# Patient Record
Sex: Male | Born: 1935 | Race: Black or African American | Hispanic: No | Marital: Married | Smoking: Never smoker
Health system: Southern US, Community
[De-identification: ages and names within clinical notes are randomized; demographics above are authoritative.]

## PROBLEM LIST (undated history)

## (undated) DIAGNOSIS — G309 Alzheimer's disease, unspecified: Secondary | ICD-10-CM

## (undated) DIAGNOSIS — F0280 Dementia in other diseases classified elsewhere without behavioral disturbance: Secondary | ICD-10-CM

## (undated) DIAGNOSIS — E119 Type 2 diabetes mellitus without complications: Secondary | ICD-10-CM

## (undated) DIAGNOSIS — I10 Essential (primary) hypertension: Secondary | ICD-10-CM

---

## 2011-11-01 DIAGNOSIS — I10 Essential (primary) hypertension: Secondary | ICD-10-CM | POA: Diagnosis not present

## 2011-11-01 DIAGNOSIS — E785 Hyperlipidemia, unspecified: Secondary | ICD-10-CM | POA: Diagnosis not present

## 2011-12-06 DIAGNOSIS — E11319 Type 2 diabetes mellitus with unspecified diabetic retinopathy without macular edema: Secondary | ICD-10-CM | POA: Diagnosis not present

## 2011-12-06 DIAGNOSIS — H2510 Age-related nuclear cataract, unspecified eye: Secondary | ICD-10-CM | POA: Diagnosis not present

## 2012-06-19 DIAGNOSIS — E11319 Type 2 diabetes mellitus with unspecified diabetic retinopathy without macular edema: Secondary | ICD-10-CM | POA: Diagnosis not present

## 2012-06-19 DIAGNOSIS — H2510 Age-related nuclear cataract, unspecified eye: Secondary | ICD-10-CM | POA: Diagnosis not present

## 2012-06-25 DIAGNOSIS — E559 Vitamin D deficiency, unspecified: Secondary | ICD-10-CM | POA: Diagnosis not present

## 2012-06-25 DIAGNOSIS — N400 Enlarged prostate without lower urinary tract symptoms: Secondary | ICD-10-CM | POA: Diagnosis not present

## 2012-06-25 DIAGNOSIS — E785 Hyperlipidemia, unspecified: Secondary | ICD-10-CM | POA: Diagnosis not present

## 2012-11-13 DIAGNOSIS — E785 Hyperlipidemia, unspecified: Secondary | ICD-10-CM | POA: Diagnosis not present

## 2012-11-13 DIAGNOSIS — F0390 Unspecified dementia without behavioral disturbance: Secondary | ICD-10-CM | POA: Diagnosis not present

## 2013-04-01 DIAGNOSIS — I10 Essential (primary) hypertension: Secondary | ICD-10-CM | POA: Diagnosis not present

## 2013-04-01 DIAGNOSIS — E119 Type 2 diabetes mellitus without complications: Secondary | ICD-10-CM | POA: Diagnosis not present

## 2013-04-05 DIAGNOSIS — R0602 Shortness of breath: Secondary | ICD-10-CM | POA: Diagnosis not present

## 2013-04-05 DIAGNOSIS — E781 Pure hyperglyceridemia: Secondary | ICD-10-CM | POA: Diagnosis not present

## 2013-04-05 DIAGNOSIS — E559 Vitamin D deficiency, unspecified: Secondary | ICD-10-CM | POA: Diagnosis not present

## 2013-04-26 DIAGNOSIS — E7800 Pure hypercholesterolemia, unspecified: Secondary | ICD-10-CM | POA: Diagnosis not present

## 2013-04-26 DIAGNOSIS — E119 Type 2 diabetes mellitus without complications: Secondary | ICD-10-CM | POA: Diagnosis not present

## 2013-04-26 DIAGNOSIS — F0390 Unspecified dementia without behavioral disturbance: Secondary | ICD-10-CM | POA: Diagnosis not present

## 2013-04-26 DIAGNOSIS — I10 Essential (primary) hypertension: Secondary | ICD-10-CM | POA: Diagnosis not present

## 2013-06-28 DIAGNOSIS — I10 Essential (primary) hypertension: Secondary | ICD-10-CM | POA: Diagnosis not present

## 2013-06-28 DIAGNOSIS — G589 Mononeuropathy, unspecified: Secondary | ICD-10-CM | POA: Diagnosis not present

## 2013-07-20 DIAGNOSIS — B351 Tinea unguium: Secondary | ICD-10-CM | POA: Diagnosis not present

## 2013-07-20 DIAGNOSIS — M79609 Pain in unspecified limb: Secondary | ICD-10-CM | POA: Diagnosis not present

## 2013-07-20 DIAGNOSIS — E119 Type 2 diabetes mellitus without complications: Secondary | ICD-10-CM | POA: Diagnosis not present

## 2013-07-27 DIAGNOSIS — IMO0002 Reserved for concepts with insufficient information to code with codable children: Secondary | ICD-10-CM | POA: Diagnosis not present

## 2013-07-27 DIAGNOSIS — G5750 Tarsal tunnel syndrome, unspecified lower limb: Secondary | ICD-10-CM | POA: Diagnosis not present

## 2013-08-27 DIAGNOSIS — I10 Essential (primary) hypertension: Secondary | ICD-10-CM | POA: Diagnosis not present

## 2013-09-27 DIAGNOSIS — E1139 Type 2 diabetes mellitus with other diabetic ophthalmic complication: Secondary | ICD-10-CM | POA: Diagnosis not present

## 2013-09-27 DIAGNOSIS — E11329 Type 2 diabetes mellitus with mild nonproliferative diabetic retinopathy without macular edema: Secondary | ICD-10-CM | POA: Diagnosis not present

## 2013-09-27 DIAGNOSIS — H2510 Age-related nuclear cataract, unspecified eye: Secondary | ICD-10-CM | POA: Diagnosis not present

## 2013-09-28 DIAGNOSIS — B351 Tinea unguium: Secondary | ICD-10-CM | POA: Diagnosis not present

## 2013-09-28 DIAGNOSIS — M79609 Pain in unspecified limb: Secondary | ICD-10-CM | POA: Diagnosis not present

## 2013-10-26 DIAGNOSIS — M79609 Pain in unspecified limb: Secondary | ICD-10-CM | POA: Diagnosis not present

## 2013-10-26 DIAGNOSIS — B351 Tinea unguium: Secondary | ICD-10-CM | POA: Diagnosis not present

## 2013-11-26 DIAGNOSIS — E119 Type 2 diabetes mellitus without complications: Secondary | ICD-10-CM | POA: Diagnosis not present

## 2013-11-26 DIAGNOSIS — F0390 Unspecified dementia without behavioral disturbance: Secondary | ICD-10-CM | POA: Diagnosis not present

## 2013-11-26 DIAGNOSIS — I10 Essential (primary) hypertension: Secondary | ICD-10-CM | POA: Diagnosis not present

## 2013-11-26 DIAGNOSIS — E7800 Pure hypercholesterolemia, unspecified: Secondary | ICD-10-CM | POA: Diagnosis not present

## 2013-12-20 DIAGNOSIS — M545 Low back pain, unspecified: Secondary | ICD-10-CM | POA: Diagnosis not present

## 2013-12-20 DIAGNOSIS — M48061 Spinal stenosis, lumbar region without neurogenic claudication: Secondary | ICD-10-CM | POA: Diagnosis not present

## 2013-12-22 DIAGNOSIS — M545 Low back pain, unspecified: Secondary | ICD-10-CM | POA: Diagnosis not present

## 2013-12-22 DIAGNOSIS — M48061 Spinal stenosis, lumbar region without neurogenic claudication: Secondary | ICD-10-CM | POA: Diagnosis not present

## 2013-12-27 DIAGNOSIS — M545 Low back pain, unspecified: Secondary | ICD-10-CM | POA: Diagnosis not present

## 2013-12-27 DIAGNOSIS — M48061 Spinal stenosis, lumbar region without neurogenic claudication: Secondary | ICD-10-CM | POA: Diagnosis not present

## 2013-12-29 DIAGNOSIS — M545 Low back pain, unspecified: Secondary | ICD-10-CM | POA: Diagnosis not present

## 2013-12-29 DIAGNOSIS — M48061 Spinal stenosis, lumbar region without neurogenic claudication: Secondary | ICD-10-CM | POA: Diagnosis not present

## 2014-01-03 DIAGNOSIS — M545 Low back pain, unspecified: Secondary | ICD-10-CM | POA: Diagnosis not present

## 2014-01-03 DIAGNOSIS — M48061 Spinal stenosis, lumbar region without neurogenic claudication: Secondary | ICD-10-CM | POA: Diagnosis not present

## 2014-01-06 DIAGNOSIS — M545 Low back pain, unspecified: Secondary | ICD-10-CM | POA: Diagnosis not present

## 2014-01-06 DIAGNOSIS — M48061 Spinal stenosis, lumbar region without neurogenic claudication: Secondary | ICD-10-CM | POA: Diagnosis not present

## 2014-01-13 DIAGNOSIS — M545 Low back pain, unspecified: Secondary | ICD-10-CM | POA: Diagnosis not present

## 2014-01-13 DIAGNOSIS — M48061 Spinal stenosis, lumbar region without neurogenic claudication: Secondary | ICD-10-CM | POA: Diagnosis not present

## 2014-01-17 DIAGNOSIS — M48061 Spinal stenosis, lumbar region without neurogenic claudication: Secondary | ICD-10-CM | POA: Diagnosis not present

## 2014-01-17 DIAGNOSIS — M545 Low back pain, unspecified: Secondary | ICD-10-CM | POA: Diagnosis not present

## 2014-01-20 DIAGNOSIS — M48061 Spinal stenosis, lumbar region without neurogenic claudication: Secondary | ICD-10-CM | POA: Diagnosis not present

## 2014-01-20 DIAGNOSIS — M545 Low back pain, unspecified: Secondary | ICD-10-CM | POA: Diagnosis not present

## 2014-01-24 DIAGNOSIS — M545 Low back pain, unspecified: Secondary | ICD-10-CM | POA: Diagnosis not present

## 2014-01-24 DIAGNOSIS — M48061 Spinal stenosis, lumbar region without neurogenic claudication: Secondary | ICD-10-CM | POA: Diagnosis not present

## 2014-01-27 DIAGNOSIS — M545 Low back pain, unspecified: Secondary | ICD-10-CM | POA: Diagnosis not present

## 2014-01-27 DIAGNOSIS — M48061 Spinal stenosis, lumbar region without neurogenic claudication: Secondary | ICD-10-CM | POA: Diagnosis not present

## 2014-01-31 DIAGNOSIS — M48061 Spinal stenosis, lumbar region without neurogenic claudication: Secondary | ICD-10-CM | POA: Diagnosis not present

## 2014-01-31 DIAGNOSIS — M545 Low back pain, unspecified: Secondary | ICD-10-CM | POA: Diagnosis not present

## 2014-02-21 DIAGNOSIS — B351 Tinea unguium: Secondary | ICD-10-CM | POA: Diagnosis not present

## 2014-02-21 DIAGNOSIS — M79609 Pain in unspecified limb: Secondary | ICD-10-CM | POA: Diagnosis not present

## 2014-02-25 DIAGNOSIS — E119 Type 2 diabetes mellitus without complications: Secondary | ICD-10-CM | POA: Diagnosis not present

## 2014-02-25 DIAGNOSIS — E7800 Pure hypercholesterolemia, unspecified: Secondary | ICD-10-CM | POA: Diagnosis not present

## 2014-02-25 DIAGNOSIS — I10 Essential (primary) hypertension: Secondary | ICD-10-CM | POA: Diagnosis not present

## 2014-02-25 DIAGNOSIS — M1250 Traumatic arthropathy, unspecified site: Secondary | ICD-10-CM | POA: Diagnosis not present

## 2014-05-06 DIAGNOSIS — E1059 Type 1 diabetes mellitus with other circulatory complications: Secondary | ICD-10-CM | POA: Diagnosis not present

## 2014-05-06 DIAGNOSIS — I739 Peripheral vascular disease, unspecified: Secondary | ICD-10-CM | POA: Diagnosis not present

## 2014-05-06 DIAGNOSIS — L608 Other nail disorders: Secondary | ICD-10-CM | POA: Diagnosis not present

## 2014-06-27 DIAGNOSIS — E119 Type 2 diabetes mellitus without complications: Secondary | ICD-10-CM | POA: Diagnosis not present

## 2014-06-27 DIAGNOSIS — R413 Other amnesia: Secondary | ICD-10-CM | POA: Diagnosis not present

## 2014-06-27 DIAGNOSIS — M1990 Unspecified osteoarthritis, unspecified site: Secondary | ICD-10-CM | POA: Diagnosis not present

## 2014-08-01 DIAGNOSIS — I10 Essential (primary) hypertension: Secondary | ICD-10-CM | POA: Diagnosis not present

## 2014-08-01 DIAGNOSIS — E1100 Type 2 diabetes mellitus with hyperosmolarity without nonketotic hyperglycemic-hyperosmolar coma (NKHHC): Secondary | ICD-10-CM | POA: Diagnosis not present

## 2014-08-01 DIAGNOSIS — E119 Type 2 diabetes mellitus without complications: Secondary | ICD-10-CM | POA: Diagnosis not present

## 2014-08-01 DIAGNOSIS — E780 Pure hypercholesterolemia: Secondary | ICD-10-CM | POA: Diagnosis not present

## 2014-08-01 DIAGNOSIS — F0390 Unspecified dementia without behavioral disturbance: Secondary | ICD-10-CM | POA: Diagnosis not present

## 2014-08-18 DIAGNOSIS — I739 Peripheral vascular disease, unspecified: Secondary | ICD-10-CM | POA: Diagnosis not present

## 2014-08-18 DIAGNOSIS — L603 Nail dystrophy: Secondary | ICD-10-CM | POA: Diagnosis not present

## 2014-08-18 DIAGNOSIS — E1051 Type 1 diabetes mellitus with diabetic peripheral angiopathy without gangrene: Secondary | ICD-10-CM | POA: Diagnosis not present

## 2014-08-18 DIAGNOSIS — L84 Corns and callosities: Secondary | ICD-10-CM | POA: Diagnosis not present

## 2014-11-10 DIAGNOSIS — I739 Peripheral vascular disease, unspecified: Secondary | ICD-10-CM | POA: Diagnosis not present

## 2014-11-10 DIAGNOSIS — L603 Nail dystrophy: Secondary | ICD-10-CM | POA: Diagnosis not present

## 2014-11-10 DIAGNOSIS — E1051 Type 1 diabetes mellitus with diabetic peripheral angiopathy without gangrene: Secondary | ICD-10-CM | POA: Diagnosis not present

## 2014-11-30 DIAGNOSIS — E1140 Type 2 diabetes mellitus with diabetic neuropathy, unspecified: Secondary | ICD-10-CM | POA: Diagnosis not present

## 2014-11-30 DIAGNOSIS — E1100 Type 2 diabetes mellitus with hyperosmolarity without nonketotic hyperglycemic-hyperosmolar coma (NKHHC): Secondary | ICD-10-CM | POA: Diagnosis not present

## 2014-11-30 DIAGNOSIS — F0390 Unspecified dementia without behavioral disturbance: Secondary | ICD-10-CM | POA: Diagnosis not present

## 2014-11-30 DIAGNOSIS — E782 Mixed hyperlipidemia: Secondary | ICD-10-CM | POA: Diagnosis not present

## 2014-11-30 DIAGNOSIS — I10 Essential (primary) hypertension: Secondary | ICD-10-CM | POA: Diagnosis not present

## 2015-01-19 DIAGNOSIS — L603 Nail dystrophy: Secondary | ICD-10-CM | POA: Diagnosis not present

## 2015-01-19 DIAGNOSIS — I739 Peripheral vascular disease, unspecified: Secondary | ICD-10-CM | POA: Diagnosis not present

## 2015-01-19 DIAGNOSIS — E1051 Type 1 diabetes mellitus with diabetic peripheral angiopathy without gangrene: Secondary | ICD-10-CM | POA: Diagnosis not present

## 2015-01-30 DIAGNOSIS — F0390 Unspecified dementia without behavioral disturbance: Secondary | ICD-10-CM | POA: Diagnosis not present

## 2015-01-30 DIAGNOSIS — I10 Essential (primary) hypertension: Secondary | ICD-10-CM | POA: Diagnosis not present

## 2015-01-30 DIAGNOSIS — R609 Edema, unspecified: Secondary | ICD-10-CM | POA: Diagnosis not present

## 2015-01-30 DIAGNOSIS — E119 Type 2 diabetes mellitus without complications: Secondary | ICD-10-CM | POA: Diagnosis not present

## 2015-03-08 DIAGNOSIS — I10 Essential (primary) hypertension: Secondary | ICD-10-CM | POA: Diagnosis not present

## 2015-03-08 DIAGNOSIS — R609 Edema, unspecified: Secondary | ICD-10-CM | POA: Diagnosis not present

## 2015-03-08 DIAGNOSIS — E1100 Type 2 diabetes mellitus with hyperosmolarity without nonketotic hyperglycemic-hyperosmolar coma (NKHHC): Secondary | ICD-10-CM | POA: Diagnosis not present

## 2015-03-08 DIAGNOSIS — E1140 Type 2 diabetes mellitus with diabetic neuropathy, unspecified: Secondary | ICD-10-CM | POA: Diagnosis not present

## 2015-03-08 DIAGNOSIS — E780 Pure hypercholesterolemia: Secondary | ICD-10-CM | POA: Diagnosis not present

## 2015-03-22 DIAGNOSIS — L03116 Cellulitis of left lower limb: Secondary | ICD-10-CM | POA: Diagnosis not present

## 2015-03-22 DIAGNOSIS — I8311 Varicose veins of right lower extremity with inflammation: Secondary | ICD-10-CM | POA: Diagnosis not present

## 2015-03-22 DIAGNOSIS — I872 Venous insufficiency (chronic) (peripheral): Secondary | ICD-10-CM | POA: Diagnosis not present

## 2015-03-22 DIAGNOSIS — I8312 Varicose veins of left lower extremity with inflammation: Secondary | ICD-10-CM | POA: Diagnosis not present

## 2015-04-06 DIAGNOSIS — L603 Nail dystrophy: Secondary | ICD-10-CM | POA: Diagnosis not present

## 2015-04-06 DIAGNOSIS — I739 Peripheral vascular disease, unspecified: Secondary | ICD-10-CM | POA: Diagnosis not present

## 2015-04-06 DIAGNOSIS — E1051 Type 1 diabetes mellitus with diabetic peripheral angiopathy without gangrene: Secondary | ICD-10-CM | POA: Diagnosis not present

## 2015-04-07 DIAGNOSIS — E1140 Type 2 diabetes mellitus with diabetic neuropathy, unspecified: Secondary | ICD-10-CM | POA: Diagnosis not present

## 2015-04-07 DIAGNOSIS — F0390 Unspecified dementia without behavioral disturbance: Secondary | ICD-10-CM | POA: Diagnosis not present

## 2015-04-07 DIAGNOSIS — L89892 Pressure ulcer of other site, stage 2: Secondary | ICD-10-CM | POA: Diagnosis not present

## 2015-05-09 DIAGNOSIS — E1136 Type 2 diabetes mellitus with diabetic cataract: Secondary | ICD-10-CM | POA: Diagnosis not present

## 2015-05-09 DIAGNOSIS — E11329 Type 2 diabetes mellitus with mild nonproliferative diabetic retinopathy without macular edema: Secondary | ICD-10-CM | POA: Diagnosis not present

## 2015-06-07 DIAGNOSIS — E1140 Type 2 diabetes mellitus with diabetic neuropathy, unspecified: Secondary | ICD-10-CM | POA: Diagnosis not present

## 2015-06-07 DIAGNOSIS — I10 Essential (primary) hypertension: Secondary | ICD-10-CM | POA: Diagnosis not present

## 2015-06-07 DIAGNOSIS — F0390 Unspecified dementia without behavioral disturbance: Secondary | ICD-10-CM | POA: Diagnosis not present

## 2015-06-07 DIAGNOSIS — L89892 Pressure ulcer of other site, stage 2: Secondary | ICD-10-CM | POA: Diagnosis not present

## 2015-06-22 DIAGNOSIS — I739 Peripheral vascular disease, unspecified: Secondary | ICD-10-CM | POA: Diagnosis not present

## 2015-06-22 DIAGNOSIS — L603 Nail dystrophy: Secondary | ICD-10-CM | POA: Diagnosis not present

## 2015-06-22 DIAGNOSIS — E1051 Type 1 diabetes mellitus with diabetic peripheral angiopathy without gangrene: Secondary | ICD-10-CM | POA: Diagnosis not present

## 2015-07-17 DIAGNOSIS — M792 Neuralgia and neuritis, unspecified: Secondary | ICD-10-CM | POA: Diagnosis not present

## 2015-10-20 DIAGNOSIS — E089 Diabetes mellitus due to underlying condition without complications: Secondary | ICD-10-CM | POA: Diagnosis not present

## 2015-10-20 DIAGNOSIS — R609 Edema, unspecified: Secondary | ICD-10-CM | POA: Diagnosis not present

## 2015-10-20 DIAGNOSIS — E782 Mixed hyperlipidemia: Secondary | ICD-10-CM | POA: Diagnosis not present

## 2015-10-20 DIAGNOSIS — Z6833 Body mass index (BMI) 33.0-33.9, adult: Secondary | ICD-10-CM | POA: Diagnosis not present

## 2015-10-20 DIAGNOSIS — E1140 Type 2 diabetes mellitus with diabetic neuropathy, unspecified: Secondary | ICD-10-CM | POA: Diagnosis not present

## 2015-11-10 DIAGNOSIS — E119 Type 2 diabetes mellitus without complications: Secondary | ICD-10-CM | POA: Diagnosis not present

## 2015-11-10 DIAGNOSIS — L249 Irritant contact dermatitis, unspecified cause: Secondary | ICD-10-CM | POA: Diagnosis not present

## 2015-11-10 DIAGNOSIS — F0390 Unspecified dementia without behavioral disturbance: Secondary | ICD-10-CM | POA: Diagnosis not present

## 2015-11-16 DIAGNOSIS — I739 Peripheral vascular disease, unspecified: Secondary | ICD-10-CM | POA: Diagnosis not present

## 2015-11-16 DIAGNOSIS — L603 Nail dystrophy: Secondary | ICD-10-CM | POA: Diagnosis not present

## 2015-11-16 DIAGNOSIS — E1051 Type 1 diabetes mellitus with diabetic peripheral angiopathy without gangrene: Secondary | ICD-10-CM | POA: Diagnosis not present

## 2016-03-01 DIAGNOSIS — E1151 Type 2 diabetes mellitus with diabetic peripheral angiopathy without gangrene: Secondary | ICD-10-CM | POA: Diagnosis not present

## 2016-03-01 DIAGNOSIS — L603 Nail dystrophy: Secondary | ICD-10-CM | POA: Diagnosis not present

## 2016-03-01 DIAGNOSIS — L97511 Non-pressure chronic ulcer of other part of right foot limited to breakdown of skin: Secondary | ICD-10-CM | POA: Diagnosis not present

## 2016-03-01 DIAGNOSIS — I739 Peripheral vascular disease, unspecified: Secondary | ICD-10-CM | POA: Diagnosis not present

## 2016-03-01 DIAGNOSIS — L84 Corns and callosities: Secondary | ICD-10-CM | POA: Diagnosis not present

## 2016-03-21 DIAGNOSIS — E1140 Type 2 diabetes mellitus with diabetic neuropathy, unspecified: Secondary | ICD-10-CM | POA: Diagnosis not present

## 2016-03-21 DIAGNOSIS — Z6832 Body mass index (BMI) 32.0-32.9, adult: Secondary | ICD-10-CM | POA: Diagnosis not present

## 2016-03-21 DIAGNOSIS — G308 Other Alzheimer's disease: Secondary | ICD-10-CM | POA: Diagnosis not present

## 2016-03-21 DIAGNOSIS — L249 Irritant contact dermatitis, unspecified cause: Secondary | ICD-10-CM | POA: Diagnosis not present

## 2016-04-10 DIAGNOSIS — L859 Epidermal thickening, unspecified: Secondary | ICD-10-CM | POA: Diagnosis not present

## 2016-04-10 DIAGNOSIS — D485 Neoplasm of uncertain behavior of skin: Secondary | ICD-10-CM | POA: Diagnosis not present

## 2016-04-10 DIAGNOSIS — L819 Disorder of pigmentation, unspecified: Secondary | ICD-10-CM | POA: Diagnosis not present

## 2016-04-26 DIAGNOSIS — M79671 Pain in right foot: Secondary | ICD-10-CM | POA: Diagnosis not present

## 2016-04-26 DIAGNOSIS — B079 Viral wart, unspecified: Secondary | ICD-10-CM | POA: Diagnosis not present

## 2016-05-20 DIAGNOSIS — M79672 Pain in left foot: Secondary | ICD-10-CM | POA: Diagnosis not present

## 2016-05-20 DIAGNOSIS — B079 Viral wart, unspecified: Secondary | ICD-10-CM | POA: Diagnosis not present

## 2016-05-20 DIAGNOSIS — M79671 Pain in right foot: Secondary | ICD-10-CM | POA: Diagnosis not present

## 2016-05-24 DIAGNOSIS — R069 Unspecified abnormalities of breathing: Secondary | ICD-10-CM | POA: Diagnosis not present

## 2016-05-24 DIAGNOSIS — E782 Mixed hyperlipidemia: Secondary | ICD-10-CM | POA: Diagnosis not present

## 2016-05-24 DIAGNOSIS — E1140 Type 2 diabetes mellitus with diabetic neuropathy, unspecified: Secondary | ICD-10-CM | POA: Diagnosis not present

## 2016-06-12 DIAGNOSIS — B079 Viral wart, unspecified: Secondary | ICD-10-CM | POA: Diagnosis not present

## 2016-06-12 DIAGNOSIS — M79672 Pain in left foot: Secondary | ICD-10-CM | POA: Diagnosis not present

## 2016-06-12 DIAGNOSIS — M79671 Pain in right foot: Secondary | ICD-10-CM | POA: Diagnosis not present

## 2016-07-08 DIAGNOSIS — L97422 Non-pressure chronic ulcer of left heel and midfoot with fat layer exposed: Secondary | ICD-10-CM | POA: Diagnosis not present

## 2016-07-26 DIAGNOSIS — L97421 Non-pressure chronic ulcer of left heel and midfoot limited to breakdown of skin: Secondary | ICD-10-CM | POA: Diagnosis not present

## 2016-07-26 DIAGNOSIS — L97411 Non-pressure chronic ulcer of right heel and midfoot limited to breakdown of skin: Secondary | ICD-10-CM | POA: Diagnosis not present

## 2016-08-23 DIAGNOSIS — G308 Other Alzheimer's disease: Secondary | ICD-10-CM | POA: Diagnosis not present

## 2016-08-23 DIAGNOSIS — E785 Hyperlipidemia, unspecified: Secondary | ICD-10-CM | POA: Diagnosis not present

## 2016-08-23 DIAGNOSIS — I10 Essential (primary) hypertension: Secondary | ICD-10-CM | POA: Diagnosis not present

## 2016-08-23 DIAGNOSIS — E1140 Type 2 diabetes mellitus with diabetic neuropathy, unspecified: Secondary | ICD-10-CM | POA: Diagnosis not present

## 2016-08-23 DIAGNOSIS — E1340 Other specified diabetes mellitus with diabetic neuropathy, unspecified: Secondary | ICD-10-CM | POA: Diagnosis not present

## 2016-08-23 DIAGNOSIS — E7800 Pure hypercholesterolemia, unspecified: Secondary | ICD-10-CM | POA: Diagnosis not present

## 2016-08-31 DIAGNOSIS — R413 Other amnesia: Secondary | ICD-10-CM | POA: Diagnosis not present

## 2016-08-31 DIAGNOSIS — R260 Ataxic gait: Secondary | ICD-10-CM | POA: Diagnosis not present

## 2016-08-31 DIAGNOSIS — I10 Essential (primary) hypertension: Secondary | ICD-10-CM | POA: Diagnosis not present

## 2016-08-31 DIAGNOSIS — Z794 Long term (current) use of insulin: Secondary | ICD-10-CM | POA: Diagnosis not present

## 2016-08-31 DIAGNOSIS — E1140 Type 2 diabetes mellitus with diabetic neuropathy, unspecified: Secondary | ICD-10-CM | POA: Diagnosis not present

## 2016-08-31 DIAGNOSIS — G309 Alzheimer's disease, unspecified: Secondary | ICD-10-CM | POA: Diagnosis not present

## 2016-09-02 DIAGNOSIS — E1140 Type 2 diabetes mellitus with diabetic neuropathy, unspecified: Secondary | ICD-10-CM | POA: Diagnosis not present

## 2016-09-02 DIAGNOSIS — R413 Other amnesia: Secondary | ICD-10-CM | POA: Diagnosis not present

## 2016-09-02 DIAGNOSIS — I10 Essential (primary) hypertension: Secondary | ICD-10-CM | POA: Diagnosis not present

## 2016-09-02 DIAGNOSIS — R260 Ataxic gait: Secondary | ICD-10-CM | POA: Diagnosis not present

## 2016-09-02 DIAGNOSIS — Z794 Long term (current) use of insulin: Secondary | ICD-10-CM | POA: Diagnosis not present

## 2016-09-02 DIAGNOSIS — G309 Alzheimer's disease, unspecified: Secondary | ICD-10-CM | POA: Diagnosis not present

## 2016-09-03 DIAGNOSIS — G309 Alzheimer's disease, unspecified: Secondary | ICD-10-CM | POA: Diagnosis not present

## 2016-09-03 DIAGNOSIS — E1140 Type 2 diabetes mellitus with diabetic neuropathy, unspecified: Secondary | ICD-10-CM | POA: Diagnosis not present

## 2016-09-03 DIAGNOSIS — Z794 Long term (current) use of insulin: Secondary | ICD-10-CM | POA: Diagnosis not present

## 2016-09-03 DIAGNOSIS — R260 Ataxic gait: Secondary | ICD-10-CM | POA: Diagnosis not present

## 2016-09-03 DIAGNOSIS — I10 Essential (primary) hypertension: Secondary | ICD-10-CM | POA: Diagnosis not present

## 2016-09-03 DIAGNOSIS — R413 Other amnesia: Secondary | ICD-10-CM | POA: Diagnosis not present

## 2016-09-09 DIAGNOSIS — I739 Peripheral vascular disease, unspecified: Secondary | ICD-10-CM | POA: Diagnosis not present

## 2016-09-09 DIAGNOSIS — L84 Corns and callosities: Secondary | ICD-10-CM | POA: Diagnosis not present

## 2016-09-09 DIAGNOSIS — E1151 Type 2 diabetes mellitus with diabetic peripheral angiopathy without gangrene: Secondary | ICD-10-CM | POA: Diagnosis not present

## 2016-09-09 DIAGNOSIS — L603 Nail dystrophy: Secondary | ICD-10-CM | POA: Diagnosis not present

## 2016-09-10 DIAGNOSIS — Z794 Long term (current) use of insulin: Secondary | ICD-10-CM | POA: Diagnosis not present

## 2016-09-10 DIAGNOSIS — E1140 Type 2 diabetes mellitus with diabetic neuropathy, unspecified: Secondary | ICD-10-CM | POA: Diagnosis not present

## 2016-09-10 DIAGNOSIS — I10 Essential (primary) hypertension: Secondary | ICD-10-CM | POA: Diagnosis not present

## 2016-09-10 DIAGNOSIS — R260 Ataxic gait: Secondary | ICD-10-CM | POA: Diagnosis not present

## 2016-09-10 DIAGNOSIS — G309 Alzheimer's disease, unspecified: Secondary | ICD-10-CM | POA: Diagnosis not present

## 2016-09-10 DIAGNOSIS — R413 Other amnesia: Secondary | ICD-10-CM | POA: Diagnosis not present

## 2016-09-11 DIAGNOSIS — R413 Other amnesia: Secondary | ICD-10-CM | POA: Diagnosis not present

## 2016-09-11 DIAGNOSIS — G309 Alzheimer's disease, unspecified: Secondary | ICD-10-CM | POA: Diagnosis not present

## 2016-09-11 DIAGNOSIS — I10 Essential (primary) hypertension: Secondary | ICD-10-CM | POA: Diagnosis not present

## 2016-09-11 DIAGNOSIS — Z794 Long term (current) use of insulin: Secondary | ICD-10-CM | POA: Diagnosis not present

## 2016-09-11 DIAGNOSIS — R260 Ataxic gait: Secondary | ICD-10-CM | POA: Diagnosis not present

## 2016-09-11 DIAGNOSIS — E1140 Type 2 diabetes mellitus with diabetic neuropathy, unspecified: Secondary | ICD-10-CM | POA: Diagnosis not present

## 2016-09-13 DIAGNOSIS — E1140 Type 2 diabetes mellitus with diabetic neuropathy, unspecified: Secondary | ICD-10-CM | POA: Diagnosis not present

## 2016-09-13 DIAGNOSIS — R260 Ataxic gait: Secondary | ICD-10-CM | POA: Diagnosis not present

## 2016-09-13 DIAGNOSIS — Z794 Long term (current) use of insulin: Secondary | ICD-10-CM | POA: Diagnosis not present

## 2016-09-13 DIAGNOSIS — G309 Alzheimer's disease, unspecified: Secondary | ICD-10-CM | POA: Diagnosis not present

## 2016-09-13 DIAGNOSIS — I10 Essential (primary) hypertension: Secondary | ICD-10-CM | POA: Diagnosis not present

## 2016-09-13 DIAGNOSIS — R413 Other amnesia: Secondary | ICD-10-CM | POA: Diagnosis not present

## 2016-09-16 DIAGNOSIS — R413 Other amnesia: Secondary | ICD-10-CM | POA: Diagnosis not present

## 2016-09-16 DIAGNOSIS — E1140 Type 2 diabetes mellitus with diabetic neuropathy, unspecified: Secondary | ICD-10-CM | POA: Diagnosis not present

## 2016-09-16 DIAGNOSIS — Z794 Long term (current) use of insulin: Secondary | ICD-10-CM | POA: Diagnosis not present

## 2016-09-16 DIAGNOSIS — R260 Ataxic gait: Secondary | ICD-10-CM | POA: Diagnosis not present

## 2016-09-16 DIAGNOSIS — G309 Alzheimer's disease, unspecified: Secondary | ICD-10-CM | POA: Diagnosis not present

## 2016-09-16 DIAGNOSIS — I10 Essential (primary) hypertension: Secondary | ICD-10-CM | POA: Diagnosis not present

## 2016-09-18 DIAGNOSIS — G309 Alzheimer's disease, unspecified: Secondary | ICD-10-CM | POA: Diagnosis not present

## 2016-09-18 DIAGNOSIS — Z794 Long term (current) use of insulin: Secondary | ICD-10-CM | POA: Diagnosis not present

## 2016-09-18 DIAGNOSIS — I10 Essential (primary) hypertension: Secondary | ICD-10-CM | POA: Diagnosis not present

## 2016-09-18 DIAGNOSIS — R260 Ataxic gait: Secondary | ICD-10-CM | POA: Diagnosis not present

## 2016-09-18 DIAGNOSIS — E1140 Type 2 diabetes mellitus with diabetic neuropathy, unspecified: Secondary | ICD-10-CM | POA: Diagnosis not present

## 2016-09-18 DIAGNOSIS — R413 Other amnesia: Secondary | ICD-10-CM | POA: Diagnosis not present

## 2016-09-20 DIAGNOSIS — E1140 Type 2 diabetes mellitus with diabetic neuropathy, unspecified: Secondary | ICD-10-CM | POA: Diagnosis not present

## 2016-09-20 DIAGNOSIS — G309 Alzheimer's disease, unspecified: Secondary | ICD-10-CM | POA: Diagnosis not present

## 2016-09-20 DIAGNOSIS — R260 Ataxic gait: Secondary | ICD-10-CM | POA: Diagnosis not present

## 2016-09-20 DIAGNOSIS — I10 Essential (primary) hypertension: Secondary | ICD-10-CM | POA: Diagnosis not present

## 2016-09-20 DIAGNOSIS — Z794 Long term (current) use of insulin: Secondary | ICD-10-CM | POA: Diagnosis not present

## 2016-09-20 DIAGNOSIS — R413 Other amnesia: Secondary | ICD-10-CM | POA: Diagnosis not present

## 2016-09-24 DIAGNOSIS — I10 Essential (primary) hypertension: Secondary | ICD-10-CM | POA: Diagnosis not present

## 2016-09-24 DIAGNOSIS — G309 Alzheimer's disease, unspecified: Secondary | ICD-10-CM | POA: Diagnosis not present

## 2016-09-24 DIAGNOSIS — E1140 Type 2 diabetes mellitus with diabetic neuropathy, unspecified: Secondary | ICD-10-CM | POA: Diagnosis not present

## 2016-09-24 DIAGNOSIS — R413 Other amnesia: Secondary | ICD-10-CM | POA: Diagnosis not present

## 2016-09-24 DIAGNOSIS — R260 Ataxic gait: Secondary | ICD-10-CM | POA: Diagnosis not present

## 2016-09-24 DIAGNOSIS — Z794 Long term (current) use of insulin: Secondary | ICD-10-CM | POA: Diagnosis not present

## 2016-09-25 DIAGNOSIS — Z794 Long term (current) use of insulin: Secondary | ICD-10-CM | POA: Diagnosis not present

## 2016-09-25 DIAGNOSIS — G309 Alzheimer's disease, unspecified: Secondary | ICD-10-CM | POA: Diagnosis not present

## 2016-09-25 DIAGNOSIS — E1140 Type 2 diabetes mellitus with diabetic neuropathy, unspecified: Secondary | ICD-10-CM | POA: Diagnosis not present

## 2016-09-25 DIAGNOSIS — R260 Ataxic gait: Secondary | ICD-10-CM | POA: Diagnosis not present

## 2016-09-25 DIAGNOSIS — R413 Other amnesia: Secondary | ICD-10-CM | POA: Diagnosis not present

## 2016-09-25 DIAGNOSIS — I10 Essential (primary) hypertension: Secondary | ICD-10-CM | POA: Diagnosis not present

## 2016-12-06 DIAGNOSIS — E1140 Type 2 diabetes mellitus with diabetic neuropathy, unspecified: Secondary | ICD-10-CM | POA: Diagnosis not present

## 2016-12-06 DIAGNOSIS — E7800 Pure hypercholesterolemia, unspecified: Secondary | ICD-10-CM | POA: Diagnosis not present

## 2016-12-06 DIAGNOSIS — G308 Other Alzheimer's disease: Secondary | ICD-10-CM | POA: Diagnosis not present

## 2016-12-06 DIAGNOSIS — E785 Hyperlipidemia, unspecified: Secondary | ICD-10-CM | POA: Diagnosis not present

## 2016-12-06 DIAGNOSIS — I10 Essential (primary) hypertension: Secondary | ICD-10-CM | POA: Diagnosis not present

## 2017-02-20 DIAGNOSIS — M79674 Pain in right toe(s): Secondary | ICD-10-CM | POA: Diagnosis not present

## 2017-02-20 DIAGNOSIS — M79675 Pain in left toe(s): Secondary | ICD-10-CM | POA: Diagnosis not present

## 2017-04-08 DIAGNOSIS — I10 Essential (primary) hypertension: Secondary | ICD-10-CM | POA: Diagnosis not present

## 2017-04-08 DIAGNOSIS — E7800 Pure hypercholesterolemia, unspecified: Secondary | ICD-10-CM | POA: Diagnosis not present

## 2017-04-08 DIAGNOSIS — E1140 Type 2 diabetes mellitus with diabetic neuropathy, unspecified: Secondary | ICD-10-CM | POA: Diagnosis not present

## 2017-04-08 DIAGNOSIS — G308 Other Alzheimer's disease: Secondary | ICD-10-CM | POA: Diagnosis not present

## 2017-04-08 DIAGNOSIS — E785 Hyperlipidemia, unspecified: Secondary | ICD-10-CM | POA: Diagnosis not present

## 2017-04-18 DIAGNOSIS — R32 Unspecified urinary incontinence: Secondary | ICD-10-CM | POA: Diagnosis not present

## 2017-04-18 DIAGNOSIS — Z87891 Personal history of nicotine dependence: Secondary | ICD-10-CM | POA: Diagnosis not present

## 2017-04-18 DIAGNOSIS — E119 Type 2 diabetes mellitus without complications: Secondary | ICD-10-CM | POA: Diagnosis not present

## 2017-04-18 DIAGNOSIS — G8929 Other chronic pain: Secondary | ICD-10-CM | POA: Diagnosis not present

## 2017-04-18 DIAGNOSIS — Z7982 Long term (current) use of aspirin: Secondary | ICD-10-CM | POA: Diagnosis not present

## 2017-04-18 DIAGNOSIS — L89892 Pressure ulcer of other site, stage 2: Secondary | ICD-10-CM | POA: Diagnosis not present

## 2017-04-18 DIAGNOSIS — Z7952 Long term (current) use of systemic steroids: Secondary | ICD-10-CM | POA: Diagnosis not present

## 2017-04-18 DIAGNOSIS — M549 Dorsalgia, unspecified: Secondary | ICD-10-CM | POA: Diagnosis not present

## 2017-04-18 DIAGNOSIS — Z9181 History of falling: Secondary | ICD-10-CM | POA: Diagnosis not present

## 2017-04-18 DIAGNOSIS — Z794 Long term (current) use of insulin: Secondary | ICD-10-CM | POA: Diagnosis not present

## 2017-04-18 DIAGNOSIS — M790 Rheumatism, unspecified: Secondary | ICD-10-CM | POA: Diagnosis not present

## 2017-04-18 DIAGNOSIS — I10 Essential (primary) hypertension: Secondary | ICD-10-CM | POA: Diagnosis not present

## 2017-04-18 DIAGNOSIS — F0390 Unspecified dementia without behavioral disturbance: Secondary | ICD-10-CM | POA: Diagnosis not present

## 2017-04-21 DIAGNOSIS — M549 Dorsalgia, unspecified: Secondary | ICD-10-CM | POA: Diagnosis not present

## 2017-04-21 DIAGNOSIS — F0390 Unspecified dementia without behavioral disturbance: Secondary | ICD-10-CM | POA: Diagnosis not present

## 2017-04-21 DIAGNOSIS — E119 Type 2 diabetes mellitus without complications: Secondary | ICD-10-CM | POA: Diagnosis not present

## 2017-04-21 DIAGNOSIS — G8929 Other chronic pain: Secondary | ICD-10-CM | POA: Diagnosis not present

## 2017-04-21 DIAGNOSIS — I10 Essential (primary) hypertension: Secondary | ICD-10-CM | POA: Diagnosis not present

## 2017-04-21 DIAGNOSIS — L89892 Pressure ulcer of other site, stage 2: Secondary | ICD-10-CM | POA: Diagnosis not present

## 2017-04-22 DIAGNOSIS — F0390 Unspecified dementia without behavioral disturbance: Secondary | ICD-10-CM | POA: Diagnosis not present

## 2017-04-22 DIAGNOSIS — G8929 Other chronic pain: Secondary | ICD-10-CM | POA: Diagnosis not present

## 2017-04-22 DIAGNOSIS — L89892 Pressure ulcer of other site, stage 2: Secondary | ICD-10-CM | POA: Diagnosis not present

## 2017-04-22 DIAGNOSIS — I10 Essential (primary) hypertension: Secondary | ICD-10-CM | POA: Diagnosis not present

## 2017-04-22 DIAGNOSIS — M549 Dorsalgia, unspecified: Secondary | ICD-10-CM | POA: Diagnosis not present

## 2017-04-22 DIAGNOSIS — E119 Type 2 diabetes mellitus without complications: Secondary | ICD-10-CM | POA: Diagnosis not present

## 2017-04-25 DIAGNOSIS — F0390 Unspecified dementia without behavioral disturbance: Secondary | ICD-10-CM | POA: Diagnosis not present

## 2017-04-25 DIAGNOSIS — G8929 Other chronic pain: Secondary | ICD-10-CM | POA: Diagnosis not present

## 2017-04-25 DIAGNOSIS — E119 Type 2 diabetes mellitus without complications: Secondary | ICD-10-CM | POA: Diagnosis not present

## 2017-04-25 DIAGNOSIS — M549 Dorsalgia, unspecified: Secondary | ICD-10-CM | POA: Diagnosis not present

## 2017-04-25 DIAGNOSIS — I10 Essential (primary) hypertension: Secondary | ICD-10-CM | POA: Diagnosis not present

## 2017-04-25 DIAGNOSIS — L89892 Pressure ulcer of other site, stage 2: Secondary | ICD-10-CM | POA: Diagnosis not present

## 2017-04-29 DIAGNOSIS — G8929 Other chronic pain: Secondary | ICD-10-CM | POA: Diagnosis not present

## 2017-04-29 DIAGNOSIS — L89892 Pressure ulcer of other site, stage 2: Secondary | ICD-10-CM | POA: Diagnosis not present

## 2017-04-29 DIAGNOSIS — E119 Type 2 diabetes mellitus without complications: Secondary | ICD-10-CM | POA: Diagnosis not present

## 2017-04-29 DIAGNOSIS — I10 Essential (primary) hypertension: Secondary | ICD-10-CM | POA: Diagnosis not present

## 2017-04-29 DIAGNOSIS — M549 Dorsalgia, unspecified: Secondary | ICD-10-CM | POA: Diagnosis not present

## 2017-04-29 DIAGNOSIS — F0390 Unspecified dementia without behavioral disturbance: Secondary | ICD-10-CM | POA: Diagnosis not present

## 2017-05-01 DIAGNOSIS — M549 Dorsalgia, unspecified: Secondary | ICD-10-CM | POA: Diagnosis not present

## 2017-05-01 DIAGNOSIS — I10 Essential (primary) hypertension: Secondary | ICD-10-CM | POA: Diagnosis not present

## 2017-05-01 DIAGNOSIS — E119 Type 2 diabetes mellitus without complications: Secondary | ICD-10-CM | POA: Diagnosis not present

## 2017-05-01 DIAGNOSIS — F0390 Unspecified dementia without behavioral disturbance: Secondary | ICD-10-CM | POA: Diagnosis not present

## 2017-05-01 DIAGNOSIS — L89892 Pressure ulcer of other site, stage 2: Secondary | ICD-10-CM | POA: Diagnosis not present

## 2017-05-01 DIAGNOSIS — G8929 Other chronic pain: Secondary | ICD-10-CM | POA: Diagnosis not present

## 2017-05-02 DIAGNOSIS — E119 Type 2 diabetes mellitus without complications: Secondary | ICD-10-CM | POA: Diagnosis not present

## 2017-05-02 DIAGNOSIS — M549 Dorsalgia, unspecified: Secondary | ICD-10-CM | POA: Diagnosis not present

## 2017-05-02 DIAGNOSIS — F0390 Unspecified dementia without behavioral disturbance: Secondary | ICD-10-CM | POA: Diagnosis not present

## 2017-05-02 DIAGNOSIS — L89892 Pressure ulcer of other site, stage 2: Secondary | ICD-10-CM | POA: Diagnosis not present

## 2017-05-02 DIAGNOSIS — G8929 Other chronic pain: Secondary | ICD-10-CM | POA: Diagnosis not present

## 2017-05-02 DIAGNOSIS — I10 Essential (primary) hypertension: Secondary | ICD-10-CM | POA: Diagnosis not present

## 2017-05-06 DIAGNOSIS — F0390 Unspecified dementia without behavioral disturbance: Secondary | ICD-10-CM | POA: Diagnosis not present

## 2017-05-06 DIAGNOSIS — I10 Essential (primary) hypertension: Secondary | ICD-10-CM | POA: Diagnosis not present

## 2017-05-06 DIAGNOSIS — G8929 Other chronic pain: Secondary | ICD-10-CM | POA: Diagnosis not present

## 2017-05-06 DIAGNOSIS — E119 Type 2 diabetes mellitus without complications: Secondary | ICD-10-CM | POA: Diagnosis not present

## 2017-05-06 DIAGNOSIS — L89892 Pressure ulcer of other site, stage 2: Secondary | ICD-10-CM | POA: Diagnosis not present

## 2017-05-06 DIAGNOSIS — M549 Dorsalgia, unspecified: Secondary | ICD-10-CM | POA: Diagnosis not present

## 2017-05-07 DIAGNOSIS — I10 Essential (primary) hypertension: Secondary | ICD-10-CM | POA: Diagnosis not present

## 2017-05-07 DIAGNOSIS — L89892 Pressure ulcer of other site, stage 2: Secondary | ICD-10-CM | POA: Diagnosis not present

## 2017-05-07 DIAGNOSIS — F0390 Unspecified dementia without behavioral disturbance: Secondary | ICD-10-CM | POA: Diagnosis not present

## 2017-05-07 DIAGNOSIS — M549 Dorsalgia, unspecified: Secondary | ICD-10-CM | POA: Diagnosis not present

## 2017-05-07 DIAGNOSIS — G8929 Other chronic pain: Secondary | ICD-10-CM | POA: Diagnosis not present

## 2017-05-07 DIAGNOSIS — E119 Type 2 diabetes mellitus without complications: Secondary | ICD-10-CM | POA: Diagnosis not present

## 2017-05-08 DIAGNOSIS — F0390 Unspecified dementia without behavioral disturbance: Secondary | ICD-10-CM | POA: Diagnosis not present

## 2017-05-08 DIAGNOSIS — L89892 Pressure ulcer of other site, stage 2: Secondary | ICD-10-CM | POA: Diagnosis not present

## 2017-05-08 DIAGNOSIS — M549 Dorsalgia, unspecified: Secondary | ICD-10-CM | POA: Diagnosis not present

## 2017-05-08 DIAGNOSIS — E119 Type 2 diabetes mellitus without complications: Secondary | ICD-10-CM | POA: Diagnosis not present

## 2017-05-08 DIAGNOSIS — I10 Essential (primary) hypertension: Secondary | ICD-10-CM | POA: Diagnosis not present

## 2017-05-08 DIAGNOSIS — G8929 Other chronic pain: Secondary | ICD-10-CM | POA: Diagnosis not present

## 2017-05-09 DIAGNOSIS — G8929 Other chronic pain: Secondary | ICD-10-CM | POA: Diagnosis not present

## 2017-05-09 DIAGNOSIS — I10 Essential (primary) hypertension: Secondary | ICD-10-CM | POA: Diagnosis not present

## 2017-05-09 DIAGNOSIS — M549 Dorsalgia, unspecified: Secondary | ICD-10-CM | POA: Diagnosis not present

## 2017-05-09 DIAGNOSIS — F0390 Unspecified dementia without behavioral disturbance: Secondary | ICD-10-CM | POA: Diagnosis not present

## 2017-05-09 DIAGNOSIS — E119 Type 2 diabetes mellitus without complications: Secondary | ICD-10-CM | POA: Diagnosis not present

## 2017-05-09 DIAGNOSIS — L89892 Pressure ulcer of other site, stage 2: Secondary | ICD-10-CM | POA: Diagnosis not present

## 2017-05-13 DIAGNOSIS — M549 Dorsalgia, unspecified: Secondary | ICD-10-CM | POA: Diagnosis not present

## 2017-05-13 DIAGNOSIS — G8929 Other chronic pain: Secondary | ICD-10-CM | POA: Diagnosis not present

## 2017-05-13 DIAGNOSIS — F0390 Unspecified dementia without behavioral disturbance: Secondary | ICD-10-CM | POA: Diagnosis not present

## 2017-05-13 DIAGNOSIS — L89892 Pressure ulcer of other site, stage 2: Secondary | ICD-10-CM | POA: Diagnosis not present

## 2017-05-13 DIAGNOSIS — I10 Essential (primary) hypertension: Secondary | ICD-10-CM | POA: Diagnosis not present

## 2017-05-13 DIAGNOSIS — E119 Type 2 diabetes mellitus without complications: Secondary | ICD-10-CM | POA: Diagnosis not present

## 2017-05-14 DIAGNOSIS — F0390 Unspecified dementia without behavioral disturbance: Secondary | ICD-10-CM | POA: Diagnosis not present

## 2017-05-14 DIAGNOSIS — E119 Type 2 diabetes mellitus without complications: Secondary | ICD-10-CM | POA: Diagnosis not present

## 2017-05-14 DIAGNOSIS — I10 Essential (primary) hypertension: Secondary | ICD-10-CM | POA: Diagnosis not present

## 2017-05-14 DIAGNOSIS — G8929 Other chronic pain: Secondary | ICD-10-CM | POA: Diagnosis not present

## 2017-05-14 DIAGNOSIS — M549 Dorsalgia, unspecified: Secondary | ICD-10-CM | POA: Diagnosis not present

## 2017-05-14 DIAGNOSIS — L89892 Pressure ulcer of other site, stage 2: Secondary | ICD-10-CM | POA: Diagnosis not present

## 2017-05-16 DIAGNOSIS — E119 Type 2 diabetes mellitus without complications: Secondary | ICD-10-CM | POA: Diagnosis not present

## 2017-05-16 DIAGNOSIS — M549 Dorsalgia, unspecified: Secondary | ICD-10-CM | POA: Diagnosis not present

## 2017-05-16 DIAGNOSIS — F0390 Unspecified dementia without behavioral disturbance: Secondary | ICD-10-CM | POA: Diagnosis not present

## 2017-05-16 DIAGNOSIS — G8929 Other chronic pain: Secondary | ICD-10-CM | POA: Diagnosis not present

## 2017-05-16 DIAGNOSIS — L89892 Pressure ulcer of other site, stage 2: Secondary | ICD-10-CM | POA: Diagnosis not present

## 2017-05-16 DIAGNOSIS — I10 Essential (primary) hypertension: Secondary | ICD-10-CM | POA: Diagnosis not present

## 2017-05-19 DIAGNOSIS — L89892 Pressure ulcer of other site, stage 2: Secondary | ICD-10-CM | POA: Diagnosis not present

## 2017-05-19 DIAGNOSIS — I10 Essential (primary) hypertension: Secondary | ICD-10-CM | POA: Diagnosis not present

## 2017-05-19 DIAGNOSIS — G8929 Other chronic pain: Secondary | ICD-10-CM | POA: Diagnosis not present

## 2017-05-19 DIAGNOSIS — M549 Dorsalgia, unspecified: Secondary | ICD-10-CM | POA: Diagnosis not present

## 2017-05-19 DIAGNOSIS — E119 Type 2 diabetes mellitus without complications: Secondary | ICD-10-CM | POA: Diagnosis not present

## 2017-05-19 DIAGNOSIS — F0390 Unspecified dementia without behavioral disturbance: Secondary | ICD-10-CM | POA: Diagnosis not present

## 2017-05-20 DIAGNOSIS — E119 Type 2 diabetes mellitus without complications: Secondary | ICD-10-CM | POA: Diagnosis not present

## 2017-05-20 DIAGNOSIS — I10 Essential (primary) hypertension: Secondary | ICD-10-CM | POA: Diagnosis not present

## 2017-05-20 DIAGNOSIS — G8929 Other chronic pain: Secondary | ICD-10-CM | POA: Diagnosis not present

## 2017-05-20 DIAGNOSIS — L89892 Pressure ulcer of other site, stage 2: Secondary | ICD-10-CM | POA: Diagnosis not present

## 2017-05-20 DIAGNOSIS — M549 Dorsalgia, unspecified: Secondary | ICD-10-CM | POA: Diagnosis not present

## 2017-05-20 DIAGNOSIS — F0390 Unspecified dementia without behavioral disturbance: Secondary | ICD-10-CM | POA: Diagnosis not present

## 2017-05-21 DIAGNOSIS — E119 Type 2 diabetes mellitus without complications: Secondary | ICD-10-CM | POA: Diagnosis not present

## 2017-05-21 DIAGNOSIS — G8929 Other chronic pain: Secondary | ICD-10-CM | POA: Diagnosis not present

## 2017-05-21 DIAGNOSIS — L89892 Pressure ulcer of other site, stage 2: Secondary | ICD-10-CM | POA: Diagnosis not present

## 2017-05-21 DIAGNOSIS — F0390 Unspecified dementia without behavioral disturbance: Secondary | ICD-10-CM | POA: Diagnosis not present

## 2017-05-21 DIAGNOSIS — I10 Essential (primary) hypertension: Secondary | ICD-10-CM | POA: Diagnosis not present

## 2017-05-21 DIAGNOSIS — M549 Dorsalgia, unspecified: Secondary | ICD-10-CM | POA: Diagnosis not present

## 2017-05-30 DIAGNOSIS — E119 Type 2 diabetes mellitus without complications: Secondary | ICD-10-CM | POA: Diagnosis not present

## 2017-05-30 DIAGNOSIS — I10 Essential (primary) hypertension: Secondary | ICD-10-CM | POA: Diagnosis not present

## 2017-05-30 DIAGNOSIS — F0390 Unspecified dementia without behavioral disturbance: Secondary | ICD-10-CM | POA: Diagnosis not present

## 2017-05-30 DIAGNOSIS — L89892 Pressure ulcer of other site, stage 2: Secondary | ICD-10-CM | POA: Diagnosis not present

## 2017-05-30 DIAGNOSIS — G8929 Other chronic pain: Secondary | ICD-10-CM | POA: Diagnosis not present

## 2017-05-30 DIAGNOSIS — M549 Dorsalgia, unspecified: Secondary | ICD-10-CM | POA: Diagnosis not present

## 2017-06-03 DIAGNOSIS — L89892 Pressure ulcer of other site, stage 2: Secondary | ICD-10-CM | POA: Diagnosis not present

## 2017-06-03 DIAGNOSIS — I10 Essential (primary) hypertension: Secondary | ICD-10-CM | POA: Diagnosis not present

## 2017-06-03 DIAGNOSIS — M549 Dorsalgia, unspecified: Secondary | ICD-10-CM | POA: Diagnosis not present

## 2017-06-03 DIAGNOSIS — G8929 Other chronic pain: Secondary | ICD-10-CM | POA: Diagnosis not present

## 2017-06-03 DIAGNOSIS — F0390 Unspecified dementia without behavioral disturbance: Secondary | ICD-10-CM | POA: Diagnosis not present

## 2017-06-03 DIAGNOSIS — E119 Type 2 diabetes mellitus without complications: Secondary | ICD-10-CM | POA: Diagnosis not present

## 2017-06-16 DIAGNOSIS — E119 Type 2 diabetes mellitus without complications: Secondary | ICD-10-CM | POA: Diagnosis not present

## 2017-06-16 DIAGNOSIS — M549 Dorsalgia, unspecified: Secondary | ICD-10-CM | POA: Diagnosis not present

## 2017-06-16 DIAGNOSIS — L89892 Pressure ulcer of other site, stage 2: Secondary | ICD-10-CM | POA: Diagnosis not present

## 2017-06-16 DIAGNOSIS — F0390 Unspecified dementia without behavioral disturbance: Secondary | ICD-10-CM | POA: Diagnosis not present

## 2017-06-16 DIAGNOSIS — I10 Essential (primary) hypertension: Secondary | ICD-10-CM | POA: Diagnosis not present

## 2017-06-16 DIAGNOSIS — G8929 Other chronic pain: Secondary | ICD-10-CM | POA: Diagnosis not present

## 2017-07-21 ENCOUNTER — Ambulatory Visit (INDEPENDENT_AMBULATORY_CARE_PROVIDER_SITE_OTHER): Payer: Self-pay | Admitting: Family

## 2017-07-25 ENCOUNTER — Inpatient Hospital Stay (HOSPITAL_COMMUNITY)
Admission: EM | Admit: 2017-07-25 | Discharge: 2017-08-01 | DRG: 871 | Disposition: A | Discharge status: Skilled Nursing Facility | Payer: Medicare Other | Attending: Internal Medicine | Admitting: Internal Medicine

## 2017-07-25 ENCOUNTER — Emergency Department (HOSPITAL_COMMUNITY): Payer: Medicare Other

## 2017-07-25 ENCOUNTER — Encounter (HOSPITAL_COMMUNITY): Payer: Self-pay | Admitting: Emergency Medicine

## 2017-07-25 DIAGNOSIS — S199XXA Unspecified injury of neck, initial encounter: Secondary | ICD-10-CM | POA: Diagnosis not present

## 2017-07-25 DIAGNOSIS — N390 Urinary tract infection, site not specified: Secondary | ICD-10-CM

## 2017-07-25 DIAGNOSIS — R4182 Altered mental status, unspecified: Secondary | ICD-10-CM | POA: Diagnosis not present

## 2017-07-25 DIAGNOSIS — S0990XA Unspecified injury of head, initial encounter: Secondary | ICD-10-CM | POA: Diagnosis not present

## 2017-07-25 DIAGNOSIS — G319 Degenerative disease of nervous system, unspecified: Secondary | ICD-10-CM | POA: Diagnosis present

## 2017-07-25 DIAGNOSIS — G9341 Metabolic encephalopathy: Secondary | ICD-10-CM | POA: Diagnosis not present

## 2017-07-25 DIAGNOSIS — Y92230 Patient room in hospital as the place of occurrence of the external cause: Secondary | ICD-10-CM | POA: Diagnosis not present

## 2017-07-25 DIAGNOSIS — G309 Alzheimer's disease, unspecified: Secondary | ICD-10-CM | POA: Diagnosis present

## 2017-07-25 DIAGNOSIS — Z91013 Allergy to seafood: Secondary | ICD-10-CM

## 2017-07-25 DIAGNOSIS — E042 Nontoxic multinodular goiter: Secondary | ICD-10-CM | POA: Diagnosis present

## 2017-07-25 DIAGNOSIS — J9811 Atelectasis: Secondary | ICD-10-CM | POA: Diagnosis not present

## 2017-07-25 DIAGNOSIS — I10 Essential (primary) hypertension: Secondary | ICD-10-CM | POA: Diagnosis present

## 2017-07-25 DIAGNOSIS — F05 Delirium due to known physiological condition: Secondary | ICD-10-CM | POA: Diagnosis not present

## 2017-07-25 DIAGNOSIS — Y92009 Unspecified place in unspecified non-institutional (private) residence as the place of occurrence of the external cause: Secondary | ICD-10-CM

## 2017-07-25 DIAGNOSIS — E876 Hypokalemia: Secondary | ICD-10-CM | POA: Diagnosis not present

## 2017-07-25 DIAGNOSIS — A419 Sepsis, unspecified organism: Principal | ICD-10-CM

## 2017-07-25 DIAGNOSIS — R000 Tachycardia, unspecified: Secondary | ICD-10-CM | POA: Diagnosis present

## 2017-07-25 DIAGNOSIS — F0280 Dementia in other diseases classified elsewhere without behavioral disturbance: Secondary | ICD-10-CM | POA: Diagnosis present

## 2017-07-25 DIAGNOSIS — R918 Other nonspecific abnormal finding of lung field: Secondary | ICD-10-CM | POA: Diagnosis present

## 2017-07-25 DIAGNOSIS — R600 Localized edema: Secondary | ICD-10-CM | POA: Diagnosis present

## 2017-07-25 DIAGNOSIS — Z8249 Family history of ischemic heart disease and other diseases of the circulatory system: Secondary | ICD-10-CM

## 2017-07-25 DIAGNOSIS — M5489 Other dorsalgia: Secondary | ICD-10-CM | POA: Diagnosis not present

## 2017-07-25 DIAGNOSIS — I878 Other specified disorders of veins: Secondary | ICD-10-CM | POA: Diagnosis present

## 2017-07-25 DIAGNOSIS — T148XXA Other injury of unspecified body region, initial encounter: Secondary | ICD-10-CM | POA: Diagnosis not present

## 2017-07-25 DIAGNOSIS — R590 Localized enlarged lymph nodes: Secondary | ICD-10-CM | POA: Diagnosis present

## 2017-07-25 DIAGNOSIS — L03115 Cellulitis of right lower limb: Secondary | ICD-10-CM | POA: Diagnosis present

## 2017-07-25 DIAGNOSIS — D72829 Elevated white blood cell count, unspecified: Secondary | ICD-10-CM | POA: Diagnosis not present

## 2017-07-25 DIAGNOSIS — B952 Enterococcus as the cause of diseases classified elsewhere: Secondary | ICD-10-CM | POA: Diagnosis present

## 2017-07-25 DIAGNOSIS — Z833 Family history of diabetes mellitus: Secondary | ICD-10-CM

## 2017-07-25 DIAGNOSIS — S299XXA Unspecified injury of thorax, initial encounter: Secondary | ICD-10-CM | POA: Diagnosis not present

## 2017-07-25 DIAGNOSIS — E119 Type 2 diabetes mellitus without complications: Secondary | ICD-10-CM

## 2017-07-25 DIAGNOSIS — T380X5A Adverse effect of glucocorticoids and synthetic analogues, initial encounter: Secondary | ICD-10-CM | POA: Diagnosis not present

## 2017-07-25 HISTORY — DX: Essential (primary) hypertension: I10

## 2017-07-25 HISTORY — DX: Alzheimer's disease, unspecified: G30.9

## 2017-07-25 HISTORY — DX: Dementia in other diseases classified elsewhere, unspecified severity, without behavioral disturbance, psychotic disturbance, mood disturbance, and anxiety: F02.80

## 2017-07-25 HISTORY — DX: Type 2 diabetes mellitus without complications: E11.9

## 2017-07-25 NOTE — ED Triage Notes (Signed)
[facility] were called out for a fall. EMS advised the son on scene stated the patient was more altered then normal. Son advised the patient does have alzheimer's but this is different and more advanced. [facility] advised they had the patient on the cardiac monitor and 12 lead showed a left bundle branch block. EMS advised the pt complains of no pain or injuries.

## 2017-07-25 NOTE — ED Provider Notes (Signed)
[facility]GENC[facility] DEPART[facility] Provider Note   CSN: [id number] Arrival date & time: 07/25/17  2226     History   Chief Complaint Chief Complaint  Patient presents with  . Fall    HPI Luis King is a 83 y.o. male with a past medical history of Alzheimer's, diabetes, hypertension, who presents to ED for evaluation of fall and declining mental status. His son states that over the past 2 weeks he has had gradual weakness and trouble completing his ADLs. His wife states that he had a fall earlier today while trying to transfer to another chair. She is unsure if he fell on his arm or his stomach. She denies any head injury or loss of consciousness. She also states that he has had gradually worsening bilateral lower extremity edema for the past 2 weeks.  HPI  Past Medical History:  Diagnosis Date  . Alzheimer's dementia   . Diabetes mellitus without complication ([place])   . Hypertension     There are no active problems to display for this patient.   History reviewed. No pertinent surgical history.     Home Medications    Prior to Admission medications   Not on File    Family History No family history on file.  Social History Social History  Substance Use Topics  . Smoking status: Never Smoker  . Smokeless tobacco: Never Used  . Alcohol use No     Allergies   Patient has no known allergies.   Review of Systems Review of Systems  Unable to perform ROS: Dementia     Physical Exam Updated Vital Signs BP (!) 142/77 (BP Location: Right Arm)   Pulse 98   Temp 98.6 F (37 C) (Oral)   Resp (!) 22   SpO2 100%   Physical Exam  Constitutional: He appears well-developed and well-nourished.  Drowsy but arousable.  HENT:  Head: Normocephalic and atraumatic.  Nose: Nose normal.  Eyes: Conjunctivae and EOM are normal. Right eye exhibits no discharge. Left eye exhibits no discharge. No scleral icterus.  Cardiovascular: Normal rate, regular  rhythm, normal heart sounds and intact distal pulses.  Exam reveals no gallop and no friction rub.   No murmur heard. Pulmonary/Chest: Effort normal and breath sounds normal. No respiratory distress.  Abdominal: Soft. Bowel sounds are normal. He exhibits no distension. There is no tenderness. There is no guarding.  Musculoskeletal: Normal range of motion. He exhibits edema.  Bilateral lower extremity edema R>L.  Neurological: He is alert.  Skin: Skin is warm and dry. No rash noted.  Psychiatric: He has a normal mood and affect.  Nursing note and vitals reviewed.    ED Treatments / Results  Labs (all labs ordered are listed, but only abnormal results are displayed) Labs Reviewed  BASIC METABOLIC PANEL  CBC WITH DIFFERENTIAL/PLATELET    EKG  EKG Interpretation  Date/Time:  Friday July 25 2017 22:29:44 EDT Ventricular Rate:  100 PR Interval:    QRS Duration: 129 QT Interval:  366 QTC Calculation: 473 R Axis:   -57 Text Interpretation:  Sinus tachycardia Borderline prolonged PR interval Probable left atrial enlargement Nonspecific IVCD with LAD Left ventricular hypertrophy Anterior infarct, old No old tracing to compare Confirmed by Delora Fuel ([id number]) on 07/25/2017 11:13:28 PM       Radiology No results found.  Procedures Procedures (including critical care time)  Medications Ordered in ED Medications - No data to display   Initial Impression / Assessment and Plan /  ED Course  I have reviewed the triage vital signs and the nursing notes.  Pertinent labs & imaging results that were available during my care of the patient were reviewed by me and considered in my medical decision making (see chart for details).     Patient presents to ED for evaluation of fall and declining mental status. He does have a history of Alzheimer's disease, diabetes, hypertension. His son states that over the past 2 weeks he has had gradual weakness and trouble completing his ADLs.  Physical exam patient is arousable but appears drowsy. He does have bilateral lower extremity edema. He is initially afebrile. Laboratory including BMP and CBC unremarkable. Urinalysis with evidence of UTI. Chest x-ray, CT of head and C-spine returned as negative. We'll admit to hospitalist for management of his UTI causing his altered mental status. Appreciate the help of hospitalist for management of this patient.  Patient discussed with and seen by Dr. Roxanne Mins.  Final Clinical Impressions(s) / ED Diagnoses   Final diagnoses:  None    New Prescriptions New Prescriptions   No medications on file     Luis Heady, PA-C 43/32/95 1884    Delora Fuel, MD 16/60/63 [phone]

## 2017-07-26 ENCOUNTER — Emergency Department (HOSPITAL_COMMUNITY): Payer: Medicare Other

## 2017-07-26 ENCOUNTER — Encounter (HOSPITAL_COMMUNITY): Payer: Self-pay | Admitting: Internal Medicine

## 2017-07-26 DIAGNOSIS — R1312 Dysphagia, oropharyngeal phase: Secondary | ICD-10-CM | POA: Diagnosis not present

## 2017-07-26 DIAGNOSIS — A419 Sepsis, unspecified organism: Secondary | ICD-10-CM | POA: Diagnosis not present

## 2017-07-26 DIAGNOSIS — N390 Urinary tract infection, site not specified: Secondary | ICD-10-CM | POA: Diagnosis not present

## 2017-07-26 DIAGNOSIS — G9341 Metabolic encephalopathy: Secondary | ICD-10-CM | POA: Diagnosis present

## 2017-07-26 DIAGNOSIS — R7989 Other specified abnormal findings of blood chemistry: Secondary | ICD-10-CM | POA: Diagnosis not present

## 2017-07-26 DIAGNOSIS — J9811 Atelectasis: Secondary | ICD-10-CM | POA: Diagnosis present

## 2017-07-26 DIAGNOSIS — R918 Other nonspecific abnormal finding of lung field: Secondary | ICD-10-CM | POA: Diagnosis present

## 2017-07-26 DIAGNOSIS — F05 Delirium due to known physiological condition: Secondary | ICD-10-CM | POA: Diagnosis present

## 2017-07-26 DIAGNOSIS — R600 Localized edema: Secondary | ICD-10-CM | POA: Diagnosis present

## 2017-07-26 DIAGNOSIS — Z833 Family history of diabetes mellitus: Secondary | ICD-10-CM | POA: Diagnosis not present

## 2017-07-26 DIAGNOSIS — Y92230 Patient room in hospital as the place of occurrence of the external cause: Secondary | ICD-10-CM | POA: Diagnosis not present

## 2017-07-26 DIAGNOSIS — Y92009 Unspecified place in unspecified non-institutional (private) residence as the place of occurrence of the external cause: Secondary | ICD-10-CM | POA: Diagnosis not present

## 2017-07-26 DIAGNOSIS — E876 Hypokalemia: Secondary | ICD-10-CM | POA: Diagnosis not present

## 2017-07-26 DIAGNOSIS — R4182 Altered mental status, unspecified: Secondary | ICD-10-CM | POA: Diagnosis not present

## 2017-07-26 DIAGNOSIS — F0280 Dementia in other diseases classified elsewhere without behavioral disturbance: Secondary | ICD-10-CM | POA: Diagnosis present

## 2017-07-26 DIAGNOSIS — R000 Tachycardia, unspecified: Secondary | ICD-10-CM | POA: Diagnosis present

## 2017-07-26 DIAGNOSIS — I10 Essential (primary) hypertension: Secondary | ICD-10-CM | POA: Diagnosis not present

## 2017-07-26 DIAGNOSIS — E119 Type 2 diabetes mellitus without complications: Secondary | ICD-10-CM | POA: Diagnosis not present

## 2017-07-26 DIAGNOSIS — Z91013 Allergy to seafood: Secondary | ICD-10-CM | POA: Diagnosis not present

## 2017-07-26 DIAGNOSIS — L03115 Cellulitis of right lower limb: Secondary | ICD-10-CM | POA: Diagnosis present

## 2017-07-26 DIAGNOSIS — G319 Degenerative disease of nervous system, unspecified: Secondary | ICD-10-CM | POA: Diagnosis present

## 2017-07-26 DIAGNOSIS — J189 Pneumonia, unspecified organism: Secondary | ICD-10-CM | POA: Diagnosis not present

## 2017-07-26 DIAGNOSIS — G9340 Encephalopathy, unspecified: Secondary | ICD-10-CM | POA: Diagnosis not present

## 2017-07-26 DIAGNOSIS — Z8249 Family history of ischemic heart disease and other diseases of the circulatory system: Secondary | ICD-10-CM | POA: Diagnosis not present

## 2017-07-26 DIAGNOSIS — I878 Other specified disorders of veins: Secondary | ICD-10-CM | POA: Diagnosis present

## 2017-07-26 DIAGNOSIS — R488 Other symbolic dysfunctions: Secondary | ICD-10-CM | POA: Diagnosis not present

## 2017-07-26 DIAGNOSIS — E042 Nontoxic multinodular goiter: Secondary | ICD-10-CM | POA: Diagnosis present

## 2017-07-26 DIAGNOSIS — R296 Repeated falls: Secondary | ICD-10-CM | POA: Diagnosis not present

## 2017-07-26 DIAGNOSIS — T380X5A Adverse effect of glucocorticoids and synthetic analogues, initial encounter: Secondary | ICD-10-CM | POA: Diagnosis not present

## 2017-07-26 DIAGNOSIS — D72829 Elevated white blood cell count, unspecified: Secondary | ICD-10-CM | POA: Diagnosis not present

## 2017-07-26 DIAGNOSIS — F339 Major depressive disorder, recurrent, unspecified: Secondary | ICD-10-CM | POA: Diagnosis not present

## 2017-07-26 DIAGNOSIS — S299XXA Unspecified injury of thorax, initial encounter: Secondary | ICD-10-CM | POA: Diagnosis not present

## 2017-07-26 DIAGNOSIS — B952 Enterococcus as the cause of diseases classified elsewhere: Secondary | ICD-10-CM | POA: Diagnosis not present

## 2017-07-26 DIAGNOSIS — R590 Localized enlarged lymph nodes: Secondary | ICD-10-CM | POA: Diagnosis present

## 2017-07-26 DIAGNOSIS — M6281 Muscle weakness (generalized): Secondary | ICD-10-CM | POA: Diagnosis not present

## 2017-07-26 DIAGNOSIS — R41841 Cognitive communication deficit: Secondary | ICD-10-CM | POA: Diagnosis not present

## 2017-07-26 DIAGNOSIS — G309 Alzheimer's disease, unspecified: Secondary | ICD-10-CM | POA: Diagnosis present

## 2017-07-26 LAB — CBC WITH DIFFERENTIAL/PLATELET
BASOS ABS: 0.1 10*3/uL (ref 0.0–0.1)
Basophils Relative: 1 %
EOS ABS: 0.2 10*3/uL (ref 0.0–0.7)
Eosinophils Relative: 3 %
HCT: 41.1 % (ref 39.0–52.0)
HEMOGLOBIN: 13.4 g/dL (ref 13.0–17.0)
Lymphocytes Relative: 25 %
Lymphs Abs: 1.3 10*3/uL (ref 0.7–4.0)
MCH: 29.3 pg (ref 26.0–34.0)
MCHC: 32.6 g/dL (ref 30.0–36.0)
MCV: 89.7 fL (ref 78.0–100.0)
MONO ABS: 0.5 10*3/uL (ref 0.1–1.0)
Monocytes Relative: 10 %
NEUTROS PCT: 61 %
Neutro Abs: 3.1 10*3/uL (ref 1.7–7.7)
PLATELETS: 118 10*3/uL — AB (ref 150–400)
RBC: 4.58 MIL/uL (ref 4.22–5.81)
RDW: 13.1 % (ref 11.5–15.5)
WBC: 5.2 10*3/uL (ref 4.0–10.5)

## 2017-07-26 LAB — URINALYSIS, ROUTINE W REFLEX MICROSCOPIC
Bilirubin Urine: NEGATIVE
GLUCOSE, UA: NEGATIVE mg/dL
Hgb urine dipstick: NEGATIVE
Ketones, ur: 5 mg/dL — AB
NITRITE: NEGATIVE
PH: 5 (ref 5.0–8.0)
PROTEIN: 100 mg/dL — AB
Specific Gravity, Urine: 1.018 (ref 1.005–1.030)

## 2017-07-26 LAB — CBG MONITORING, ED
GLUCOSE-CAPILLARY: 138 mg/dL — AB (ref 65–99)
GLUCOSE-CAPILLARY: 103 mg/dL — AB (ref 65–99)

## 2017-07-26 LAB — GLUCOSE, CAPILLARY
Glucose-Capillary: 153 mg/dL — ABNORMAL HIGH (ref 65–99)
Glucose-Capillary: 143 mg/dL — ABNORMAL HIGH (ref 65–99)

## 2017-07-26 LAB — BASIC METABOLIC PANEL
ANION GAP: 10 (ref 5–15)
BUN: 10 mg/dL (ref 6–20)
CO2: 25 mmol/L (ref 22–32)
CREATININE: 1.02 mg/dL (ref 0.61–1.24)
Calcium: 9.2 mg/dL (ref 8.9–10.3)
Chloride: 103 mmol/L (ref 101–111)
Glucose, Bld: 149 mg/dL — ABNORMAL HIGH (ref 65–99)
Potassium: 3.7 mmol/L (ref 3.5–5.1)
SODIUM: 138 mmol/L (ref 135–145)

## 2017-07-26 LAB — LACTIC ACID, PLASMA
Lactic Acid, Venous: 1.5 mmol/L (ref 0.5–1.9)
Lactic Acid, Venous: 2.1 mmol/L (ref 0.5–1.9)

## 2017-07-26 MED ORDER — DEXTROSE 5 % IV SOLN
1.0000 g | INTRAVENOUS | Status: DC
  Administered 2017-07-27 – 2017-07-30 (×4): 1 g via INTRAVENOUS
  Filled 2017-07-26 (×5): qty 10

## 2017-07-26 MED ORDER — ONDANSETRON HCL 4 MG/2ML IJ SOLN: 4.0000 mg | Freq: Four times a day (QID) | INTRAMUSCULAR | Status: DC | PRN

## 2017-07-26 MED ORDER — INSULIN ASPART 100 UNIT/ML ~~LOC~~ SOLN: 0.0000 [IU] | SUBCUTANEOUS | Status: DC

## 2017-07-26 MED ORDER — ACETAMINOPHEN 325 MG PO TABS
650.0000 mg | ORAL_TABLET | Freq: Four times a day (QID) | ORAL | Status: DC | PRN
  Administered 2017-07-26 – 2017-07-31 (×3): 650 mg via ORAL
  Filled 2017-07-26 (×3): qty 2

## 2017-07-26 MED ORDER — DEXTROSE 5 % IV SOLN
1.0000 g | Freq: Once | INTRAVENOUS | Status: AC
  Administered 2017-07-26: 1 g via INTRAVENOUS
  Filled 2017-07-26: qty 10

## 2017-07-26 MED ORDER — INSULIN ASPART 100 UNIT/ML ~~LOC~~ SOLN
0.0000 [IU] | SUBCUTANEOUS | Status: DC
  Administered 2017-07-26: 1 [IU] via SUBCUTANEOUS
  Administered 2017-07-26: 2 [IU] via SUBCUTANEOUS
  Administered 2017-07-26 – 2017-07-27 (×3): 1 [IU] via SUBCUTANEOUS
  Administered 2017-07-27: 2 [IU] via SUBCUTANEOUS
  Administered 2017-07-27: 1 [IU] via SUBCUTANEOUS
  Filled 2017-07-26: qty 1

## 2017-07-26 MED ORDER — ONDANSETRON HCL 4 MG PO TABS
4.0000 mg | ORAL_TABLET | Freq: Four times a day (QID) | ORAL | Status: DC | PRN
  Administered 2017-07-31: 4 mg via ORAL
  Filled 2017-07-26: qty 1

## 2017-07-26 MED ORDER — ACETAMINOPHEN 650 MG RE SUPP
650.0000 mg | Freq: Four times a day (QID) | RECTAL | Status: DC | PRN
  Administered 2017-07-29 – 2017-07-30 (×2): 650 mg via RECTAL
  Filled 2017-07-26 (×2): qty 1

## 2017-07-26 MED ORDER — ENOXAPARIN SODIUM 40 MG/0.4ML ~~LOC~~ SOLN
40.0000 mg | SUBCUTANEOUS | Status: DC
  Administered 2017-07-26 – 2017-07-31 (×6): 40 mg via SUBCUTANEOUS
  Filled 2017-07-26 (×7): qty 0.4

## 2017-07-26 MED ORDER — SODIUM CHLORIDE 0.9 % IV BOLUS (SEPSIS)
1000.0000 mL | Freq: Once | INTRAVENOUS | Status: AC
  Administered 2017-07-26: 1000 mL via INTRAVENOUS

## 2017-07-26 NOTE — ED Notes (Signed)
Lab tech unable to draw blood  Cultures.

## 2017-07-26 NOTE — ED Notes (Signed)
Pt is sleeping. Family at bedside

## 2017-07-26 NOTE — Progress Notes (Signed)
Results for ROHAN, JUENGER (MRN [id number]) as of 07/26/2017 17:28  Ref. Range 07/26/2017 08:59 07/26/2017 12:21 07/26/2017 16:02 07/26/2017 16:03 07/26/2017 16:05  Lactic Acid, Venous Latest Ref Range: 0.5 - 1.9 mmol/L    2.1 (HH)    Crt. Lab value called into DR. Eliseo Squires

## 2017-07-26 NOTE — Progress Notes (Signed)
  PROGRESS NOTE    Luis King  SFS:[id number] DOB: [birth year] DOA: 07/25/2017 PCP: Lucianne Lei, MD   Chief Complaint  Patient presents with  . Fall     Brief Narrative:  HPI On 07/26/2017 by Dr. Jennette Kettle Luis King is a 83 y.o. male with medical history significant of Alzheimer's, DM, HTN.  Patient brought in by son after a fall at home.  Not injured and not in pain but son notes that over past 2 weeks has had gradual increased weakness, difficulty completing ADLs.  Assessment & Plan   Admitted earlier today by Dr. Alcario Drought.  See H&P for details.  Sepsis secondary to urinary tract infection -Presented with fever, tachycardia, tachypnea -UA showed many bacteria, TNTC WBC, large leukocytes -Pending blood and urine cultures -Lactic acid 1.5 -Continue ceftriaxone  Acute metabolic encephalopathy -With underlying dementia at baseline -Suspect secondary to urinary tract infection CT head showed atrophy -With chronic appearing small vessel ischemia.  No acute intracranial abnormality. -will continue to monitor  Essential hypertension -Need son to provide list of medications  -blood pressure currently stable -Continue to monitor and start IV medications as needed  Diabetes mellitus, type II -Continue insulin sliding scale CBG monitoring -Need family to provide list of medications  Multinodular goiter -Noted on CT cervical spine, patient will need outpatient follow-up/workup  DVT Prophylaxis  Lovenox  Code Status: Full  Family Communication:  none at bedside  Disposition Plan: Admitted.  dispo TBD  Consultants None  Procedures  none    LOS: 0 days   Time Spent in minutes   30 minutes  Angelize Ryce D.O. on 07/26/2017 at 11:00 AM  Between 7am to 7pm - Pager - [phone]  After 7pm go to [email or web address] - password [email or web address]  And look for the night coverage person covering for me after hours  [facility] Office  [phone]

## 2017-07-26 NOTE — ED Notes (Signed)
Ordered pt breakfast tray

## 2017-07-26 NOTE — H&P (Signed)
History and Physical    Luis King EXH:[id number] DOB: [birth year] DOA: 07/25/2017  PCP: Lucianne Lei, MD  Patient coming from: Home  I have personally briefly reviewed patient's old medical records in [place]  Chief Complaint: Fall, [place]  HPI: Luis King is a 83 y.o. male with medical history significant of Alzheimer's, DM, HTN.  Patient brought in by son after a fall at home.  Not injured and not in pain but son notes that over past 2 weeks has had gradual increased weakness, difficulty completing ADLs.   ED Course: Found to have UTI.   Review of Systems: Unable to perform due to dementia.   Past Medical History:  Diagnosis Date  . Alzheimer's dementia   . Diabetes mellitus without complication ([place])   . Hypertension     History reviewed. No pertinent surgical history.   reports that he has never smoked. He has never used smokeless tobacco. He reports that he does not drink alcohol or use drugs.  No Known Allergies  Family History  Problem Relation Age of Onset  . High blood pressure Son   . Diabetes Son      Prior to Admission medications   Not on File    Physical Exam: Vitals:   07/26/17 0302 07/26/17 0445 07/26/17 0446 07/26/17 0500  BP: 124/77 (!) 161/80  (!) 175/159  Pulse: 90 (!) 123  (!) 105  Resp: (!) 21 (!) 24  (!) 22  Temp:   (!) 100.6 F (38.1 C)   TempSrc:   Rectal   SpO2: 96% 96%  98%    Constitutional: NAD, Drowsy, comfortable Eyes: PERRL, lids and conjunctivae normal ENMT: Mucous membranes are moist. Posterior pharynx clear of any exudate or lesions.Normal dentition.  Neck: normal, supple, no masses, no thyromegaly Respiratory: clear to auscultation bilaterally, no wheezing, no crackles. Normal respiratory effort. No accessory muscle use.  Cardiovascular: Regular rate and rhythm, no murmurs / rubs / gallops. BLE edema R>L 2+ pedal pulses. No carotid bruits.  Abdomen: no tenderness, no masses palpated. No hepatosplenomegaly.  Bowel sounds positive.  Musculoskeletal: no clubbing / cyanosis. No joint deformity upper and lower extremities. Good ROM, no contractures. Normal muscle tone.  Skin: no rashes, lesions, ulcers. No induration Neurologic: CN 2-12 grossly intact. Sensation intact, DTR normal. Strength 5/5 in all 4.  Psychiatric: Drowsy but arousable   Labs on Admission: I have personally reviewed following labs and imaging studies  CBC:  Recent Labs Lab 07/26/17 0032  WBC 5.2  NEUTROABS 3.1  HGB 13.4  HCT 41.1  MCV 89.7  PLT 381*   Basic Metabolic Panel:  Recent Labs Lab 07/26/17 0032  NA 138  K 3.7  CL 103  CO2 25  GLUCOSE 149*  BUN 10  CREATININE 1.02  CALCIUM 9.2   GFR: CrCl cannot be calculated (Unknown ideal weight.). Liver Function Tests: No results for input(s): AST, ALT, ALKPHOS, BILITOT, PROT, ALBUMIN in the last 168 hours. No results for input(s): LIPASE, AMYLASE in the last 168 hours. No results for input(s): AMMONIA in the last 168 hours. Coagulation Profile: No results for input(s): INR, PROTIME in the last 168 hours. Cardiac Enzymes: No results for input(s): CKTOTAL, CKMB, CKMBINDEX, TROPONINI in the last 168 hours. BNP (last 3 results) No results for input(s): PROBNP in the last 8760 hours. HbA1C: No results for input(s): HGBA1C in the last 72 hours. CBG: No results for input(s): GLUCAP in the last 168 hours. Lipid Profile: No results for input(s): CHOL, HDL,  LDLCALC, TRIG, CHOLHDL, LDLDIRECT in the last 72 hours. Thyroid Function Tests: No results for input(s): TSH, T4TOTAL, FREET4, T3FREE, THYROIDAB in the last 72 hours. Anemia Panel: No results for input(s): VITAMINB12, FOLATE, FERRITIN, TIBC, IRON, RETICCTPCT in the last 72 hours. Urine analysis:    Component Value Date/Time   COLORURINE YELLOW 07/25/2017 0450   APPEARANCEUR CLOUDY (A) 07/25/2017 0450   LABSPEC 1.018 07/25/2017 0450   PHURINE 5.0 07/25/2017 0450   GLUCOSEU NEGATIVE 07/25/2017 0450    HGBUR NEGATIVE 07/25/2017 0450   BILIRUBINUR NEGATIVE 07/25/2017 0450   KETONESUR 5 (A) 07/25/2017 0450   PROTEINUR 100 (A) 07/25/2017 0450   NITRITE NEGATIVE 07/25/2017 0450   LEUKOCYTESUR LARGE (A) 07/25/2017 0450    Radiological Exams on Admission: Dg Chest 2 View  Result Date: 07/26/2017 CLINICAL DATA:  83 y/o  M; fall and altered mental status. EXAM: CHEST  2 VIEW COMPARISON:  None. FINDINGS: Normal cardiac silhouette given projection and technique. Low lung volumes accentuate pulmonary markings. No consolidation, effusion, or pneumothorax. No acute osseous abnormality is evident. IMPRESSION: Low lung volumes.  No acute pulmonary process identified. Electronically Signed   By: Kristine Garbe M.D.   On: 07/26/2017 00:15   Ct Head Wo Contrast  Result Date: 07/25/2017 CLINICAL DATA:  Minor head trauma. Alzheimer's disease. Altered mental status. EXAM: CT HEAD WITHOUT CONTRAST CT CERVICAL SPINE WITHOUT CONTRAST TECHNIQUE: Multidetector CT imaging of the head and cervical spine was performed following the standard protocol without intravenous contrast. Multiplanar CT image reconstructions of the cervical spine were also generated. COMPARISON:  None. FINDINGS: CT HEAD FINDINGS BRAIN: There is sulcal and ventricular prominence consistent with superficial and central atrophy. No intraparenchymal hemorrhage, mass effect nor midline shift. Periventricular and subcortical white matter hypodensities consistent with chronic small vessel ischemic disease are identified. Chronic appearing left colonic lacunar infarct. No acute large vascular territory infarcts. No abnormal extra-axial fluid collections. Basal cisterns are not effaced and midline. VASCULAR: Moderate calcific atherosclerosis of the carotid siphons. SKULL: No skull fracture. No significant scalp soft tissue swelling. SINUSES/ORBITS: The mastoid air-cells are clear. The included paranasal sinuses are well-aerated.The included ocular  globes and orbital contents are non-suspicious. OTHER: None. CT CERVICAL SPINE FINDINGS Alignment: Maintained cervical lordosis. Intact craniocervical relationship and atlantodental interval. Skull base and vertebrae: No acute fracture. No primary bone lesion or focal pathologic process. The left C4 vertebral foramen is slightly wider than left likely developmental. Soft tissues and spinal canal: No prevertebral fluid or swelling. No visible canal hematoma. Disc levels: Moderate disc space narrowing C4-5 and C6-7 with small posterior marginal osteophytes. Mild disc space narrowing at C5-6. No jumped or perched facets. Partial ankylosis of the left C2-3 facet. Mild left C3-4 and bilateral C7-T1 facet arthropathy with joint space narrowing and sclerosis. Bilateral uncovertebral joint osteoarthritis with spurring noted at C4-5 and C6-7. Right-sided uncovertebral spurring at C5-6. Upper chest:  Nonacute Other: The thyroid gland is enlarged, right lobe greater than left with heterogeneous appearing parenchyma. Small bilateral cystic nodules are noted, some of which may complex with proteinaceous material within. IMPRESSION: 1. Atrophy with chronic appearing small vessel ischemia. No acute intracranial abnormality. 2. Cervical spondylosis without acute cervical spine fracture or posttraumatic subluxation. 3. Heterogeneous enlargement of the thyroid gland consistent with multinodular goiter. Electronically Signed   By: Ashley Royalty M.D.   On: 07/25/2017 23:55   Ct Cervical Spine Wo Contrast  Result Date: 07/25/2017 CLINICAL DATA:  Minor head trauma. Alzheimer's disease. Altered mental status. EXAM: CT HEAD  WITHOUT CONTRAST CT CERVICAL SPINE WITHOUT CONTRAST TECHNIQUE: Multidetector CT imaging of the head and cervical spine was performed following the standard protocol without intravenous contrast. Multiplanar CT image reconstructions of the cervical spine were also generated. COMPARISON:  None. FINDINGS: CT HEAD  FINDINGS BRAIN: There is sulcal and ventricular prominence consistent with superficial and central atrophy. No intraparenchymal hemorrhage, mass effect nor midline shift. Periventricular and subcortical white matter hypodensities consistent with chronic small vessel ischemic disease are identified. Chronic appearing left colonic lacunar infarct. No acute large vascular territory infarcts. No abnormal extra-axial fluid collections. Basal cisterns are not effaced and midline. VASCULAR: Moderate calcific atherosclerosis of the carotid siphons. SKULL: No skull fracture. No significant scalp soft tissue swelling. SINUSES/ORBITS: The mastoid air-cells are clear. The included paranasal sinuses are well-aerated.The included ocular globes and orbital contents are non-suspicious. OTHER: None. CT CERVICAL SPINE FINDINGS Alignment: Maintained cervical lordosis. Intact craniocervical relationship and atlantodental interval. Skull base and vertebrae: No acute fracture. No primary bone lesion or focal pathologic process. The left C4 vertebral foramen is slightly wider than left likely developmental. Soft tissues and spinal canal: No prevertebral fluid or swelling. No visible canal hematoma. Disc levels: Moderate disc space narrowing C4-5 and C6-7 with small posterior marginal osteophytes. Mild disc space narrowing at C5-6. No jumped or perched facets. Partial ankylosis of the left C2-3 facet. Mild left C3-4 and bilateral C7-T1 facet arthropathy with joint space narrowing and sclerosis. Bilateral uncovertebral joint osteoarthritis with spurring noted at C4-5 and C6-7. Right-sided uncovertebral spurring at C5-6. Upper chest:  Nonacute Other: The thyroid gland is enlarged, right lobe greater than left with heterogeneous appearing parenchyma. Small bilateral cystic nodules are noted, some of which may complex with proteinaceous material within. IMPRESSION: 1. Atrophy with chronic appearing small vessel ischemia. No acute intracranial  abnormality. 2. Cervical spondylosis without acute cervical spine fracture or posttraumatic subluxation. 3. Heterogeneous enlargement of the thyroid gland consistent with multinodular goiter. Electronically Signed   By: Ashley Royalty M.D.   On: 07/25/2017 23:55    EKG: Independently reviewed.  Assessment/Plan Principal Problem:   Acute metabolic encephalopathy Active Problems:   Acute lower UTI   DM2 (diabetes mellitus, type 2) ([facility])   HTN (hypertension)    1. Acute metabolic encephalopathy - 1. Delirium secondary to UTI 2. Does have Alzheimer's at baseline 2. UTI - 1. Rocephin 2. UCx ordered 3. Tylenol PRN fever 4. Checking lactate, but hopefully not too elevated given that his BPs have been running [id number] systolic in the ED. 5. 1L IVF in ED, then will hold off on further fluids for now pending lactate results 3. HTN - 1. Plan to continue home BP meds if BPs indeed remain elevated this morning 2. Son is headed to house to get patients home meds so we know what he was on. 4. DM2 - 1. Will put in sensitive SSI q4h for now 2. 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 Family Communication: Son at bedside Disposition Plan: Home after admit Consults called: None Admission status: Admit to inpatient - inpatient status for encephalopathy due to UTI   Etta Quill DO [facility] Pager [phone]  If 7AM-7PM, please contact day team taking care of patient [email or web address] Password [email or web address]  07/26/2017, 6:13 AM

## 2017-07-26 NOTE — ED Notes (Signed)
  CBG 103  

## 2017-07-26 NOTE — ED Notes (Signed)
CBG 138

## 2017-07-26 NOTE — ED Notes (Signed)
Pt sitting up eating lunch with assistance from son.

## 2017-07-26 NOTE — ED Notes (Signed)
Pt incontinent of urine; peri care done, brief changed

## 2017-07-26 NOTE — ED Notes (Signed)
Admitting md at bedside,  States he will add orders.  Nurse will wait til admitting md put in new orders.

## 2017-07-26 NOTE — Progress Notes (Signed)
Pt. Has Arrive to the unit Alert and oriented to self only. Pt. Disoriented to Place, time, and situation. Pt. Stated he was not in any pain. Pt. Was oriented to equipment in the room.

## 2017-07-27 LAB — BASIC METABOLIC PANEL
ANION GAP: 10 (ref 5–15)
BUN: 8 mg/dL (ref 6–20)
CALCIUM: 8.8 mg/dL — AB (ref 8.9–10.3)
CO2: 25 mmol/L (ref 22–32)
Chloride: 103 mmol/L (ref 101–111)
Creatinine, Ser: 1.03 mg/dL (ref 0.61–1.24)
Glucose, Bld: 137 mg/dL — ABNORMAL HIGH (ref 65–99)
Potassium: 3.5 mmol/L (ref 3.5–5.1)
Sodium: 138 mmol/L (ref 135–145)

## 2017-07-27 LAB — GLUCOSE, CAPILLARY
GLUCOSE-CAPILLARY: 139 mg/dL — AB (ref 65–99)
GLUCOSE-CAPILLARY: 151 mg/dL — AB (ref 65–99)
Glucose-Capillary: 127 mg/dL — ABNORMAL HIGH (ref 65–99)
Glucose-Capillary: 138 mg/dL — ABNORMAL HIGH (ref 65–99)
Glucose-Capillary: 144 mg/dL — ABNORMAL HIGH (ref 65–99)
Glucose-Capillary: 179 mg/dL — ABNORMAL HIGH (ref 65–99)

## 2017-07-27 LAB — CBC
HEMATOCRIT: 40.4 % (ref 39.0–52.0)
Hemoglobin: 13.5 g/dL (ref 13.0–17.0)
MCH: 29.7 pg (ref 26.0–34.0)
MCHC: 33.4 g/dL (ref 30.0–36.0)
MCV: 89 fL (ref 78.0–100.0)
Platelets: 140 10*3/uL — ABNORMAL LOW (ref 150–400)
RBC: 4.54 MIL/uL (ref 4.22–5.81)
RDW: 12.7 % (ref 11.5–15.5)
WBC: 8 10*3/uL (ref 4.0–10.5)

## 2017-07-27 MED ORDER — INSULIN ASPART 100 UNIT/ML ~~LOC~~ SOLN
0.0000 [IU] | Freq: Every day | SUBCUTANEOUS | Status: DC
  Administered 2017-07-28 – 2017-07-29 (×2): 2 [IU] via SUBCUTANEOUS

## 2017-07-27 MED ORDER — PREDNISONE 5 MG PO TABS
7.5000 mg | ORAL_TABLET | Freq: Every day | ORAL | Status: DC
  Administered 2017-07-27 – 2017-08-01 (×5): 7.5 mg via ORAL
  Filled 2017-07-27 (×6): qty 2

## 2017-07-27 MED ORDER — INSULIN ASPART 100 UNIT/ML ~~LOC~~ SOLN
0.0000 [IU] | Freq: Three times a day (TID) | SUBCUTANEOUS | Status: DC
  Administered 2017-07-28: 2 [IU] via SUBCUTANEOUS
  Administered 2017-07-28 – 2017-07-29 (×3): 3 [IU] via SUBCUTANEOUS
  Administered 2017-07-29: 5 [IU] via SUBCUTANEOUS
  Administered 2017-07-29: 3 [IU] via SUBCUTANEOUS
  Administered 2017-07-30 (×3): 2 [IU] via SUBCUTANEOUS
  Administered 2017-07-31 (×2): 3 [IU] via SUBCUTANEOUS
  Administered 2017-08-01: 2 [IU] via SUBCUTANEOUS

## 2017-07-27 NOTE — Progress Notes (Signed)
Patient lethargic, sleepy , difficult to arouse and when he is alert fades to be drowsy.  Poor appetite, refused meal.  Did eat 2 spoonfuls of applesauce with crushed med.  Will swallow and swollows well with alertness however slowly and with many reminders to take spoonful.

## 2017-07-27 NOTE — Progress Notes (Signed)
PROGRESS NOTE    Luis King  WUJ:[id number] DOB: 04-17-1930 DOA: 07/25/2017 PCP: Lucianne Lei, MD   Outpatient Specialists:     Brief Narrative:  HPI On 07/26/2017 by Dr. Jennette Kettle Darelle [name] is a 83 y.o. male with medical history significant of Alzheimer's, DM, HTN.  Patient brought in by son after a fall at home.  Not injured and not in pain but son notes that over past 2 weeks has had gradual increased weakness, difficulty completing ADLs.  Assessment & Plan    Sepsis secondary to urinary tract infection -Presented with fever, tachycardia, tachypnea -UA showed many bacteria, TNTC WBC, large leukocytes -Pending blood and urine cultures -Lactic acid 1.5 -Continue ceftriaxone  Acute metabolic encephalopathy -With underlying dementia at baseline -Suspect secondary to urinary tract infection CT head showed atrophy -With chronic appearing small vessel ischemia.  No acute intracranial abnormality. -will continue to monitor  Essential hypertension -hold meds for now  Diabetes mellitus, type II -Continue insulin sliding scale CBG monitoring  Multinodular goiter -Noted on CT cervical spine, patient will need outpatient follow-up/workup    DVT prophylaxis:  Lovenox   Code Status: Full Code  Family Communication: No family at bedside  Disposition Plan:     Consultants:       Subjective: Sweaty appearing, ate only a few bites per NT  Objective: Vitals:   07/26/17 2100 07/27/17 0042 07/27/17 0503 07/27/17 1044  BP: 133/64 137/60 128/66 126/70  Pulse: (!) 101 97 92 92  Resp: 18 18 17 18   Temp: 99.8 F (37.7 C) 99.2 F (37.3 C) 98.8 F (37.1 C) (!) 100.5 F (38.1 C)  TempSrc: Axillary Axillary Axillary Axillary  SpO2: 97% 96% 97% 96%    Intake/Output Summary (Last 24 hours) at 07/27/17 1303 Last data filed at 07/27/17 1301  Gross per 24 hour  Intake              240 ml  Output              350 ml  Net             -110 ml   There were no  vitals filed for this visit.  Examination:  General exam: skin warm and moist Lips dry and cracked Respiratory system: Clear to auscultation. Respiratory effort normal. Cardiovascular system: rrr Gastrointestinal system: +BS, soft Central nervous system: will open eyes Psychiatry: normal mood    Data Reviewed: I have personally reviewed following labs and imaging studies  CBC:  Recent Labs Lab 07/26/17 0032 07/27/17 0313  WBC 5.2 8.0  NEUTROABS 3.1  --   HGB 13.4 13.5  HCT 41.1 40.4  MCV 89.7 89.0  PLT 118* 956*   Basic Metabolic Panel:  Recent Labs Lab 07/26/17 0032 07/27/17 0313  NA 138 138  K 3.7 3.5  CL 103 103  CO2 25 25  GLUCOSE 149* 137*  BUN 10 8  CREATININE 1.02 1.03  CALCIUM 9.2 8.8*   GFR: CrCl cannot be calculated (Unknown ideal weight.). Liver Function Tests: No results for input(s): AST, ALT, ALKPHOS, BILITOT, PROT, ALBUMIN in the last 168 hours. No results for input(s): LIPASE, AMYLASE in the last 168 hours. No results for input(s): AMMONIA in the last 168 hours. Coagulation Profile: No results for input(s): INR, PROTIME in the last 168 hours. Cardiac Enzymes: No results for input(s): CKTOTAL, CKMB, CKMBINDEX, TROPONINI in the last 168 hours. BNP (last 3 results) No results for input(s): PROBNP in the last 8760 hours. HbA1C: No  results for input(s): HGBA1C in the last 72 hours. CBG:  Recent Labs Lab 07/26/17 2125 07/27/17 0037 07/27/17 0446 07/27/17 0735 07/27/17 1152  GLUCAP 143* 138* 144* 139* 151*   Lipid Profile: No results for input(s): CHOL, HDL, LDLCALC, TRIG, CHOLHDL, LDLDIRECT in the last 72 hours. Thyroid Function Tests: No results for input(s): TSH, T4TOTAL, FREET4, T3FREE, THYROIDAB in the last 72 hours. Anemia Panel: No results for input(s): VITAMINB12, FOLATE, FERRITIN, TIBC, IRON, RETICCTPCT in the last 72 hours. Urine analysis:    Component Value Date/Time   COLORURINE YELLOW 07/25/2017 0450   APPEARANCEUR  CLOUDY (A) 07/25/2017 0450   LABSPEC 1.018 07/25/2017 0450   PHURINE 5.0 07/25/2017 0450   GLUCOSEU NEGATIVE 07/25/2017 0450   HGBUR NEGATIVE 07/25/2017 [place] 07/25/2017 0450   KETONESUR 5 (A) 07/25/2017 0450   PROTEINUR 100 (A) 07/25/2017 0450   NITRITE NEGATIVE 07/25/2017 0450   LEUKOCYTESUR LARGE (A) 07/25/2017 0450     )No results found for this or any previous visit (from the past 240 hour(s)).    Anti-infectives    Start     Dose/Rate Route Frequency Ordered Stop   07/27/17 0530  cefTRIAXone (ROCEPHIN) 1 g in dextrose 5 % 50 mL IVPB     1 g 100 mL/hr over 30 Minutes Intravenous Every 24 hours 07/26/17 0541     07/26/17 0530  cefTRIAXone (ROCEPHIN) 1 g in dextrose 5 % 50 mL IVPB     1 g 100 mL/hr over 30 Minutes Intravenous  Once 07/26/17 0522 07/26/17 0631       Radiology Studies: Dg Chest 2 View  Result Date: 07/26/2017 CLINICAL DATA:  83 y/o  M; fall and altered mental status. EXAM: CHEST  2 VIEW COMPARISON:  None. FINDINGS: Normal cardiac silhouette given projection and technique. Low lung volumes accentuate pulmonary markings. No consolidation, effusion, or pneumothorax. No acute osseous abnormality is evident. IMPRESSION: Low lung volumes.  No acute pulmonary process identified. Electronically Signed   By: Kristine Garbe M.D.   On: 07/26/2017 00:15   Ct Head Wo Contrast  Result Date: 07/25/2017 CLINICAL DATA:  Minor head trauma. Alzheimer's disease. Altered mental status. EXAM: CT HEAD WITHOUT CONTRAST CT CERVICAL SPINE WITHOUT CONTRAST TECHNIQUE: Multidetector CT imaging of the head and cervical spine was performed following the standard protocol without intravenous contrast. Multiplanar CT image reconstructions of the cervical spine were also generated. COMPARISON:  None. FINDINGS: CT HEAD FINDINGS BRAIN: There is sulcal and ventricular prominence consistent with superficial and central atrophy. No intraparenchymal hemorrhage, mass  effect nor midline shift. Periventricular and subcortical white matter hypodensities consistent with chronic small vessel ischemic disease are identified. Chronic appearing left colonic lacunar infarct. No acute large vascular territory infarcts. No abnormal extra-axial fluid collections. Basal cisterns are not effaced and midline. VASCULAR: Moderate calcific atherosclerosis of the carotid siphons. SKULL: No skull fracture. No significant scalp soft tissue swelling. SINUSES/ORBITS: The mastoid air-cells are clear. The included paranasal sinuses are well-aerated.The included ocular globes and orbital contents are non-suspicious. OTHER: None. CT CERVICAL SPINE FINDINGS Alignment: Maintained cervical lordosis. Intact craniocervical relationship and atlantodental interval. Skull base and vertebrae: No acute fracture. No primary bone lesion or focal pathologic process. The left C4 vertebral foramen is slightly wider than left likely developmental. Soft tissues and spinal canal: No prevertebral fluid or swelling. No visible canal hematoma. Disc levels: Moderate disc space narrowing C4-5 and C6-7 with small posterior marginal osteophytes. Mild disc space narrowing at C5-6. No jumped or perched  facets. Partial ankylosis of the left C2-3 facet. Mild left C3-4 and bilateral C7-T1 facet arthropathy with joint space narrowing and sclerosis. Bilateral uncovertebral joint osteoarthritis with spurring noted at C4-5 and C6-7. Right-sided uncovertebral spurring at C5-6. Upper chest:  Nonacute Other: The thyroid gland is enlarged, right lobe greater than left with heterogeneous appearing parenchyma. Small bilateral cystic nodules are noted, some of which may complex with proteinaceous material within. IMPRESSION: 1. Atrophy with chronic appearing small vessel ischemia. No acute intracranial abnormality. 2. Cervical spondylosis without acute cervical spine fracture or posttraumatic subluxation. 3. Heterogeneous enlargement of the  thyroid gland consistent with multinodular goiter. Electronically Signed   By: Ashley Royalty M.D.   On: 07/25/2017 23:55   Ct Cervical Spine Wo Contrast  Result Date: 07/25/2017 CLINICAL DATA:  Minor head trauma. Alzheimer's disease. Altered mental status. EXAM: CT HEAD WITHOUT CONTRAST CT CERVICAL SPINE WITHOUT CONTRAST TECHNIQUE: Multidetector CT imaging of the head and cervical spine was performed following the standard protocol without intravenous contrast. Multiplanar CT image reconstructions of the cervical spine were also generated. COMPARISON:  None. FINDINGS: CT HEAD FINDINGS BRAIN: There is sulcal and ventricular prominence consistent with superficial and central atrophy. No intraparenchymal hemorrhage, mass effect nor midline shift. Periventricular and subcortical white matter hypodensities consistent with chronic small vessel ischemic disease are identified. Chronic appearing left colonic lacunar infarct. No acute large vascular territory infarcts. No abnormal extra-axial fluid collections. Basal cisterns are not effaced and midline. VASCULAR: Moderate calcific atherosclerosis of the carotid siphons. SKULL: No skull fracture. No significant scalp soft tissue swelling. SINUSES/ORBITS: The mastoid air-cells are clear. The included paranasal sinuses are well-aerated.The included ocular globes and orbital contents are non-suspicious. OTHER: None. CT CERVICAL SPINE FINDINGS Alignment: Maintained cervical lordosis. Intact craniocervical relationship and atlantodental interval. Skull base and vertebrae: No acute fracture. No primary bone lesion or focal pathologic process. The left C4 vertebral foramen is slightly wider than left likely developmental. Soft tissues and spinal canal: No prevertebral fluid or swelling. No visible canal hematoma. Disc levels: Moderate disc space narrowing C4-5 and C6-7 with small posterior marginal osteophytes. Mild disc space narrowing at C5-6. No jumped or perched facets.  Partial ankylosis of the left C2-3 facet. Mild left C3-4 and bilateral C7-T1 facet arthropathy with joint space narrowing and sclerosis. Bilateral uncovertebral joint osteoarthritis with spurring noted at C4-5 and C6-7. Right-sided uncovertebral spurring at C5-6. Upper chest:  Nonacute Other: The thyroid gland is enlarged, right lobe greater than left with heterogeneous appearing parenchyma. Small bilateral cystic nodules are noted, some of which may complex with proteinaceous material within. IMPRESSION: 1. Atrophy with chronic appearing small vessel ischemia. No acute intracranial abnormality. 2. Cervical spondylosis without acute cervical spine fracture or posttraumatic subluxation. 3. Heterogeneous enlargement of the thyroid gland consistent with multinodular goiter. Electronically Signed   By: Ashley Royalty M.D.   On: 07/25/2017 23:55        Scheduled Meds: . enoxaparin (LOVENOX) injection  40 mg Subcutaneous Q24H  . insulin aspart  0-5 Units Subcutaneous QHS  . insulin aspart  0-9 Units Subcutaneous TID WC   Continuous Infusions: . cefTRIAXone (ROCEPHIN)  IV 1 g (07/27/17 0544)     LOS: 1 day    Time spent: 35 min    [place], DO [facility] Pager [phone]  If 7PM-7AM, please contact night-coverage [email or web address] Password [email or web address] 07/27/2017, 1:03 PM

## 2017-07-28 ENCOUNTER — Encounter (HOSPITAL_COMMUNITY): Payer: Self-pay | Admitting: General Practice

## 2017-07-28 LAB — BASIC METABOLIC PANEL
Anion gap: 19 — ABNORMAL HIGH (ref 5–15)
BUN: 10 mg/dL (ref 6–20)
CHLORIDE: 100 mmol/L — AB (ref 101–111)
CO2: 23 mmol/L (ref 22–32)
CREATININE: 1.13 mg/dL (ref 0.61–1.24)
Calcium: 8.6 mg/dL — ABNORMAL LOW (ref 8.9–10.3)
GFR calc non Af Amer: 57 mL/min — ABNORMAL LOW (ref >60)
Glucose, Bld: 195 mg/dL — ABNORMAL HIGH (ref 65–99)
POTASSIUM: 4.2 mmol/L (ref 3.5–5.1)
Sodium: 142 mmol/L (ref 135–145)

## 2017-07-28 LAB — GLUCOSE, CAPILLARY
GLUCOSE-CAPILLARY: 165 mg/dL — AB (ref 65–99)
GLUCOSE-CAPILLARY: 185 mg/dL — AB (ref 65–99)
GLUCOSE-CAPILLARY: 217 mg/dL — AB (ref 65–99)
GLUCOSE-CAPILLARY: 225 mg/dL — AB (ref 65–99)
GLUCOSE-CAPILLARY: 248 mg/dL — AB (ref 65–99)
Glucose-Capillary: 185 mg/dL — ABNORMAL HIGH (ref 65–99)
Glucose-Capillary: 247 mg/dL — ABNORMAL HIGH (ref 65–99)

## 2017-07-28 LAB — CBC
HEMATOCRIT: 47.5 % (ref 39.0–52.0)
HEMOGLOBIN: 16.2 g/dL (ref 13.0–17.0)
MCH: 30.5 pg (ref 26.0–34.0)
MCHC: 34.1 g/dL (ref 30.0–36.0)
MCV: 89.3 fL (ref 78.0–100.0)
PLATELETS: 129 10*3/uL — AB (ref 150–400)
RBC: 5.32 MIL/uL (ref 4.22–5.81)
RDW: 13.2 % (ref 11.5–15.5)
WBC: 13.2 10*3/uL — ABNORMAL HIGH (ref 4.0–10.5)

## 2017-07-28 MED ORDER — SODIUM CHLORIDE 0.9 % IV SOLN
INTRAVENOUS | Status: DC
  Administered 2017-07-28: 12:00:00 via INTRAVENOUS
  Administered 2017-07-29: 1000 mL via INTRAVENOUS
  Administered 2017-07-30: 22:00:00 via INTRAVENOUS
  Administered 2017-07-30: 1000 mL via INTRAVENOUS
  Administered 2017-07-30 – 2017-07-31 (×2): via INTRAVENOUS
  Administered 2017-08-01: 1000 mL via INTRAVENOUS

## 2017-07-28 NOTE — Progress Notes (Signed)
PROGRESS NOTE    Luis King  WVP:[id number] DOB: 10/16/1929 DOA: 07/25/2017 PCP: Lucianne Lei, MD   Chief Complaint  Patient presents with  . Fall    Brief Narrative:  HPI On 07/26/2017 by Dr. Nikki Dom [name] a 83 y.o.malewith medical history significant of Alzheimer's, DM, HTN. Patient brought in by son after a fall at home. Not injured and not in pain but son notes that over past 2 weeks has had gradual increased weakness, difficulty completing ADLs.  Assessment & Plan   Sepsis secondary to urinary tract infection -Presented with fever, tachycardia, tachypnea -UA showed many bacteria, TNTC WBC, large leukocytes -Blood cultures show no growth -urine culture still not collected, called microbiology -Continue ceftriaxone  Acute metabolic encephalopathy -With underlying dementia at baseline -Suspect secondary to urinary tract infection CT head showed atrophy with chronic appearing small vessel ischemia. No acute intracranial abnormality. -will continue to monitor  Essential hypertension -Lisinopril, lasix, HCTZ held  -blood pressure currently stable -Continue to monitor and start IV medications as needed  Diabetes mellitus, type II -Continue insulin sliding scale CBG monitoring -70/30 held  Multinodular goiter -Noted on CT cervical spine, patient will need outpatient follow-up/workup  Deconditioning -PT consulted and recommended SNF  DVT Prophylaxis  Lovenox  Code Status: Full  Family Communication:  none at bedside  Disposition Plan: Admitted.  Dispo SNF  Consultants None  Procedures  None  Antibiotics   Anti-infectives    Start     Dose/Rate Route Frequency Ordered Stop   07/27/17 0530  cefTRIAXone (ROCEPHIN) 1 g in dextrose 5 % 50 mL IVPB     1 g 100 mL/hr over 30 Minutes Intravenous Every 24 hours 07/26/17 0541     07/26/17 0530  cefTRIAXone (ROCEPHIN) 1 g in dextrose 5 % 50 mL IVPB     1 g 100 mL/hr over 30 Minutes  Intravenous  Once 07/26/17 0522 07/26/17 0631      Subjective:   Luis King seen and examined today.  Aware of himself (has dementia). Has no complaints.   Objective:   Vitals:   07/27/17 2100 07/28/17 0100 07/28/17 0559 07/28/17 1028  BP: 121/67 138/68 127/72 105/65  Pulse: 88 94 90 (!) 124  Resp: 18 18 19 18   Temp: 98 F (36.7 C) 98.5 F (36.9 C) 98.7 F (37.1 C) 98.5 F (36.9 C)  TempSrc: Axillary Axillary Axillary Axillary  SpO2: 98% 99% 99% 95%    Intake/Output Summary (Last 24 hours) at 07/28/17 1210 Last data filed at 07/28/17 0830  Gross per 24 hour  Intake              230 ml  Output             1950 ml  Net            -1720 ml   There were no vitals filed for this visit.  Exam  General: Well developed, well nourished, NAD, appears stated age  19: NCAT, mucous membranes dry  Cardiovascular: S1 S2 auscultated, RRR, no murmus  Respiratory: Clear to auscultation bilaterally   Abdomen: Soft, nontender, nondistended, + bowel sounds  Extremities: warm dry without cyanosis clubbing or edema  Neuro: AAOx1 (self only), able to move all extremities   Psych: Appropriate mood and affect   Data Reviewed: I have personally reviewed following labs and imaging studies  CBC:  Recent Labs Lab 07/26/17 0032 07/27/17 0313 07/28/17 0622  WBC 5.2 8.0 13.2*  NEUTROABS 3.1  --   --  HGB 13.4 13.5 16.2  HCT 41.1 40.4 47.5  MCV 89.7 89.0 89.3  PLT 118* 140* 948*   Basic Metabolic Panel:  Recent Labs Lab 07/26/17 0032 07/27/17 0313 07/28/17 0622  NA 138 138 142  K 3.7 3.5 4.2  CL 103 103 100*  CO2 25 25 23   GLUCOSE 149* 137* 195*  BUN 10 8 10   CREATININE 1.02 1.03 1.13  CALCIUM 9.2 8.8* 8.6*   GFR: CrCl cannot be calculated (Unknown ideal weight.). Liver Function Tests: No results for input(s): AST, ALT, ALKPHOS, BILITOT, PROT, ALBUMIN in the last 168 hours. No results for input(s): LIPASE, AMYLASE in the last 168 hours. No results for  input(s): AMMONIA in the last 168 hours. Coagulation Profile: No results for input(s): INR, PROTIME in the last 168 hours. Cardiac Enzymes: No results for input(s): CKTOTAL, CKMB, CKMBINDEX, TROPONINI in the last 168 hours. BNP (last 3 results) No results for input(s): PROBNP in the last 8760 hours. HbA1C: No results for input(s): HGBA1C in the last 72 hours. CBG:  Recent Labs Lab 07/27/17 1954 07/28/17 0005 07/28/17 0433 07/28/17 0742 07/28/17 1125  GLUCAP 179* 165* 185* 185* 217*   Lipid Profile: No results for input(s): CHOL, HDL, LDLCALC, TRIG, CHOLHDL, LDLDIRECT in the last 72 hours. Thyroid Function Tests: No results for input(s): TSH, T4TOTAL, FREET4, T3FREE, THYROIDAB in the last 72 hours. Anemia Panel: No results for input(s): VITAMINB12, FOLATE, FERRITIN, TIBC, IRON, RETICCTPCT in the last 72 hours. Urine analysis:    Component Value Date/Time   COLORURINE YELLOW 07/25/2017 0450   APPEARANCEUR CLOUDY (A) 07/25/2017 0450   LABSPEC 1.018 07/25/2017 0450   PHURINE 5.0 07/25/2017 0450   GLUCOSEU NEGATIVE 07/25/2017 0450   HGBUR NEGATIVE 07/25/2017 0450   BILIRUBINUR NEGATIVE 07/25/2017 0450   KETONESUR 5 (A) 07/25/2017 0450   PROTEINUR 100 (A) 07/25/2017 0450   NITRITE NEGATIVE 07/25/2017 0450   LEUKOCYTESUR LARGE (A) 07/25/2017 0450   Sepsis Labs: @LABRCNTIP (procalcitonin:4,lacticidven:4)  ) Recent Results (from the past 240 hour(s))  Culture, blood (routine x 2)     Status: None (Preliminary result)   Collection Time: 07/26/17  4:02 PM  Result Value Ref Range Status   Specimen Description BLOOD BLOOD LEFT HAND  Final   Special Requests IN PEDIATRIC BOTTLE Blood Culture adequate volume  Final   Culture NO GROWTH 2 DAYS  Final   Report Status PENDING  Incomplete  Culture, blood (routine x 2)     Status: None (Preliminary result)   Collection Time: 07/26/17  4:05 PM  Result Value Ref Range Status   Specimen Description BLOOD BLOOD LEFT HAND  Final    Special Requests IN PEDIATRIC BOTTLE Blood Culture adequate volume  Final   Culture NO GROWTH 2 DAYS  Final   Report Status PENDING  Incomplete      Radiology Studies: No results found.   Scheduled Meds: . enoxaparin (LOVENOX) injection  40 mg Subcutaneous Q24H  . insulin aspart  0-5 Units Subcutaneous QHS  . insulin aspart  0-9 Units Subcutaneous TID WC  . predniSONE  7.5 mg Oral Q breakfast   Continuous Infusions: . sodium chloride 75 mL/hr at 07/28/17 1134  . cefTRIAXone (ROCEPHIN)  IV Stopped (07/28/17 0600)     LOS: 2 days   Time Spent in minutes   30 minutes  Hasson Gaspard D.O. on 07/28/2017 at 12:10 PM  Between 7am to 7pm - Pager - [phone]  After 7pm go to [email or web address] - password [email or web address]  And look for the  night coverage person covering for me after hours  [facility] Office  [phone]

## 2017-07-28 NOTE — Progress Notes (Signed)
Physical Therapy Evaluation Patient Details Name: Luis King MRN: [id number] DOB: [birth year] Today's Date: 07/28/2017   History of Present Illness  Pt is an 83 y.o.malewith PMH of Alzheimer's, DM, HTN. Patient was brought to ED by son after a fall at home. Son noted that over past 2 weeks has had gradual increased weakness and difficulty completing ADLs.  Clinical Impression  Patient presents with the above. Evaluated patient and noted findings below. Patient has history of Alzheimer's dementia at baseline. At this time patient is presenting with increased cognitive deficits, generalized weakness and needing increased assistance +2 for mobility. At this time feel patient is unsafe to d/c home and recommending SNF to address deficits and maximize function. PT will continue to follow acutely and progress as tolerated.    Follow Up Recommendations SNF;Supervision/Assistance - 24 hour    Equipment Recommendations   (TBD)    Recommendations for Other Services       Precautions / Restrictions Precautions Precautions: Fall Restrictions Weight Bearing Restrictions: No      Mobility  Bed Mobility Overal bed mobility: Needs Assistance Bed Mobility: Supine to Sit;Sit to Supine     Supine to sit: Max assist;+2 for physical assistance Sit to supine: Max assist;+2 for physical assistance   General bed mobility comments: max assist +2 with use of bed pad to translate LEs to EOB, needed max A to elevate trunk. Able to maintain sitting EOB without support  Transfers Overall transfer level: Needs assistance   Transfers: Sit to/from Stand Sit to Stand: +2 physical assistance;Max assist         General transfer comment: max assist +2 with wrap around gait belt and bed pad, R knee blocked as well to attempt stand; unable to achieve full stand  Ambulation/Gait                Stairs            Wheelchair Mobility    Modified Rankin (Stroke Patients Only)        Balance Overall balance assessment: Needs assistance Sitting-balance support: Feet supported;Bilateral upper extremity supported Sitting balance-Leahy Scale: Fair Sitting balance - Comments: pt was able to maintain sitting EOB without support; spontaneous weight shifting demonstrated. Balance not challenged significantly   Standing balance support: During functional activity Standing balance-Leahy Scale: Zero Standing balance comment: pt is unable to achieve a full stand at this time despite 2+ max physical assistance                             Pertinent Vitals/Pain      Home Living Family/patient expects to be discharged to:: Private residence Living Arrangements: Spouse/significant other               Additional Comments: D/t patient cognitive status, unable to gather accurate home living arrangements    Prior Function Level of Independence: Needs assistance         Comments: per son in ED note, pt has needed increased assistance with ADLs and has gradually gotten weaker over the last several weeks     Hand Dominance        Extremity/Trunk Assessment   Upper Extremity Assessment Upper Extremity Assessment: Defer to OT evaluation    Lower Extremity Assessment Lower Extremity Assessment: Difficult to assess due to impaired cognition RLE Deficits / Details: DF/PF 4-/5; unable to gain appropriate responses for other strength testing; sensation and coordination deferred d/t cognitive status;  LLE Deficits / Details: DF/PF 4-/5; unable to gain appropriate responses for other strength testing; sensation and coordination deferred d/t cognitive status;         Communication   Communication: HOH (difficult to understand pt speech; possible HOH)  Cognition Arousal/Alertness: Lethargic Behavior During Therapy: Anxious Overall Cognitive Status: Impaired/Different from baseline Area of Impairment: Orientation;Attention;Memory;Following  commands;Safety/judgement;Awareness;Problem solving                 Orientation Level: Disoriented to;Person;Place;Time;Situation Current Attention Level: Focused Memory: Decreased short-term memory Following Commands: Follows one step commands inconsistently;Follows one step commands with increased time Safety/Judgement: Decreased awareness of deficits;Decreased awareness of safety Awareness: Intellectual Problem Solving: Slow processing;Decreased initiation;Difficulty sequencing;Requires verbal cues;Requires tactile cues General Comments: difficult to arouse but was arousable      General Comments      Exercises     Assessment/Plan    PT Assessment Patient needs continued PT services  PT Problem List Decreased strength;Decreased range of motion;Decreased activity tolerance;Decreased balance;Decreased mobility;Decreased coordination;Decreased cognition;Decreased knowledge of use of DME;Decreased safety awareness       PT Treatment Interventions DME instruction;Gait training;Stair training;Functional mobility training;Therapeutic activities;Therapeutic exercise;Balance training;Neuromuscular re-education;Patient/family education    PT Goals (Current goals can be found in the Care Plan section)  Acute Rehab PT Goals Patient Stated Goal: none stated PT Goal Formulation: Patient unable to participate in goal setting Time For Goal Achievement: 08/11/17 Potential to Achieve Goals: Fair    Frequency Min 3X/week   Barriers to discharge        Co-evaluation               AM-PAC PT "6 Clicks" Daily Activity  Outcome Measure Difficulty turning over in bed (including adjusting bedclothes, sheets and blankets)?: Unable Difficulty moving from lying on back to sitting on the side of the bed? : Unable Difficulty sitting down on and standing up from a chair with arms (e.g., wheelchair, bedside commode, etc,.)?: Unable Help needed moving to and from a bed to chair  (including a wheelchair)?: Total Help needed walking in hospital room?: Total Help needed climbing 3-5 steps with a railing? : Total 6 Click Score: 6    End of Session Equipment Utilized During Treatment: Gait belt Activity Tolerance: Patient tolerated treatment well Patient left: in bed;with bed alarm set;with call bell/phone within reach Nurse Communication: Mobility status;Need for lift equipment PT Visit Diagnosis: Muscle weakness (generalized) (M62.81);Other symptoms and signs involving the nervous system (R29.898);Other abnormalities of gait and mobility (R26.89)    Time: 0349-1791 PT Time Calculation (min) (ACUTE ONLY): 23 min   Charges:   PT Evaluation $PT Eval Moderate Complexity: 1 Mod PT Treatments $Therapeutic Activity: 8-22 mins   PT G CodesSindy Guadeloupe, SPT [phone] office   Margarita Grizzle 07/28/2017, 11:21 AM

## 2017-07-28 NOTE — Progress Notes (Signed)
Pt alert but sleepy.  Attempted to give pt po liquids and pt has very delayed swallow. Attempted bite of soft food and pt pocketed food w/o swallowing. Completed meticulous mouth care. Notified MD of pts unsafe swallowing. Making pt npo unitil further notice.

## 2017-07-28 NOTE — Evaluation (Signed)
Clinical/Bedside Swallow Evaluation Patient Details  Name: Luis King MRN: [id number] Date of Birth: 16-Mar-1930  Today's Date: 07/28/2017 Time: SLP Start Time (ACUTE ONLY): 1140 SLP Stop Time (ACUTE ONLY): 1158 SLP Time Calculation (min) (ACUTE ONLY): 18 min  Past Medical History:  Past Medical History:  Diagnosis Date  . Alzheimer's dementia   . Diabetes mellitus without complication ([place])   . Hypertension    Past Surgical History: History reviewed. No pertinent surgical history. HPI:  Luis King a 83 y.o.malewith medical history significant of Alzheimer's, DM, HTN. Patient brought in by son after a fall at home. Not injured and not in pain but son notes that over past 2 weeks has had gradual increased weakness, difficulty completing ADLs. Current problems include sepsis 2/2 UTI, acute metabolic encephalopathy.    Assessment / Plan / Recommendation Clinical Impression  Pt tolerated BSE with no overt s/sx of aspiration noted across consistencies; however, oral phase characterized by anterior spillage of thin liquid via cup sips and prolonged mastication/oral residue following solid consistency. Timing of swallow is adequate via subjective throat palpation by SLP. 2/2 pt's waxing and waning cognitive status, concern for swallow safety remains. Recommend Dys 1 (puree) diet with thin liquids and meds crushed in puree with potential for diet advancement. Will f/u for diet tolerance.    SLP Visit Diagnosis: Dysphagia, unspecified (R13.10)    Aspiration Risk  Mild aspiration risk    Diet Recommendation Dysphagia 1 (Puree);Thin liquid   Liquid Administration via: Straw;Cup Medication Administration: Crushed with puree Supervision: Full supervision/cueing for compensatory strategies Compensations: Small sips/bites;Minimize environmental distractions Postural Changes: Seated upright at 90 degrees;Remain upright for at least 30 minutes after po intake    Other  Recommendations Oral  Care Recommendations: Oral care QID   Follow up Recommendations Skilled Nursing facility      Frequency and Duration min 2x/week  2 weeks       Prognosis Prognosis for Safe Diet Advancement: Good Barriers to Reach Goals: Cognitive deficits;Severity of deficits      Swallow Study   General HPI: Luis King a 83 y.o.malewith medical history significant of Alzheimer's, DM, HTN. Patient brought in by son after a fall at home. Not injured and not in pain but son notes that over past 2 weeks has had gradual increased weakness, difficulty completing ADLs. Current problems include sepsis 2/2 UTI, acute metabolic encephalopathy.  Type of Study: Bedside Swallow Evaluation Diet Prior to this Study: NPO Temperature Spikes Noted: Yes (100.8 on 10/20) Respiratory Status: Room air History of Recent Intubation: No Behavior/Cognition: Alert;Cooperative;Distractible;Requires cueing Oral Cavity Assessment: Dried secretions;Lesions Oral Care Completed by SLP: No Oral Cavity - Dentition: Dentures, top;Poor condition;Missing dentition (Partial top) Self-Feeding Abilities: Total assist Patient Positioning: Upright in bed Baseline Vocal Quality: Breathy;Low vocal intensity    Oral/Motor/Sensory Function Overall Oral Motor/Sensory Function: Within functional limits   Ice Chips Ice chips: Not tested   Thin Liquid Thin Liquid: Impaired Presentation: Cup;Straw Oral Phase Functional Implications: Right anterior spillage    Nectar Thick Nectar Thick Liquid: Not tested   Honey Thick Honey Thick Liquid: Not tested   Puree Puree: Within functional limits   Solid   GO   Solid: Impaired Oral Phase Functional Implications: Impaired mastication;Oral residue        Aaron Edelman, Student SLP 07/28/2017,12:22 PM

## 2017-07-29 ENCOUNTER — Inpatient Hospital Stay (HOSPITAL_COMMUNITY): Payer: Medicare Other

## 2017-07-29 DIAGNOSIS — E876 Hypokalemia: Secondary | ICD-10-CM

## 2017-07-29 DIAGNOSIS — A419 Sepsis, unspecified organism: Principal | ICD-10-CM

## 2017-07-29 LAB — GLUCOSE, CAPILLARY
GLUCOSE-CAPILLARY: 190 mg/dL — AB (ref 65–99)
GLUCOSE-CAPILLARY: 201 mg/dL — AB (ref 65–99)
GLUCOSE-CAPILLARY: 202 mg/dL — AB (ref 65–99)
GLUCOSE-CAPILLARY: 270 mg/dL — AB (ref 65–99)
Glucose-Capillary: 202 mg/dL — ABNORMAL HIGH (ref 65–99)
Glucose-Capillary: 210 mg/dL — ABNORMAL HIGH (ref 65–99)

## 2017-07-29 LAB — CBC
HCT: 42.6 % (ref 39.0–52.0)
HEMOGLOBIN: 14.4 g/dL (ref 13.0–17.0)
MCH: 30.3 pg (ref 26.0–34.0)
MCHC: 33.8 g/dL (ref 30.0–36.0)
MCV: 89.5 fL (ref 78.0–100.0)
PLATELETS: 154 10*3/uL (ref 150–400)
RBC: 4.76 MIL/uL (ref 4.22–5.81)
RDW: 13.3 % (ref 11.5–15.5)
WBC: 13 10*3/uL — AB (ref 4.0–10.5)

## 2017-07-29 LAB — BASIC METABOLIC PANEL
ANION GAP: 9 (ref 5–15)
BUN: 20 mg/dL (ref 6–20)
CHLORIDE: 107 mmol/L (ref 101–111)
CO2: 24 mmol/L (ref 22–32)
Calcium: 8.6 mg/dL — ABNORMAL LOW (ref 8.9–10.3)
Creatinine, Ser: 1.12 mg/dL (ref 0.61–1.24)
GFR calc Af Amer: >60 mL/min (ref >60)
GFR, EST NON AFRICAN AMERICAN: 58 mL/min — AB (ref >60)
GLUCOSE: 195 mg/dL — AB (ref 65–99)
POTASSIUM: 3.4 mmol/L — AB (ref 3.5–5.1)
SODIUM: 140 mmol/L (ref 135–145)

## 2017-07-29 MED ORDER — POTASSIUM CHLORIDE CRYS ER 20 MEQ PO TBCR
40.0000 meq | EXTENDED_RELEASE_TABLET | Freq: Once | ORAL | Status: AC
  Administered 2017-07-29: 40 meq via ORAL
  Filled 2017-07-29: qty 2

## 2017-07-29 NOTE — Consult Note (Signed)
[facility] CM Primary Care Navigator  07/29/2017  Luis King September 23, 1930 [id number]   Met with patient at the bedside but he was unable to answer any questions appropriately (history of Alzheimer's dementia). Called and spoke with son Coralyn Pear) to identify patient's possible discharge needs. Son reports that patient had a fall at home and was noted having "weakness, no appetite and sleeping a lot" that had led to this admission.  Patient's endorses Dr. Lucianne Lei with  [facility] as the primary care provider.   Patient's son shared using [facility] on [address] and [place] service to obtain medications without any difficulty so far.   Son reportsthat patient's wife has been managing medications at home using "pill box" system filled weekly.  Per son, heprovides transportation to patient's doctors' appointments.  Patient's wife is theprimary caregiver at home and son assists with his care needs as stated.  Anticipated discharge plan is skilled nursing facility (SNF) per Inpatient CM. According to son, plan is more likely to be long term placement since patient's wife has also health issues that makes her not physically able to care for patient. Son also mentioned that he is looking into placement for both patient and wife.  Son was made aware that if ever patientreturns back home,to call primary care provider's office and schedule a post discharge follow-upwithin a week or sooner if needed. Patient letter (with PCP's contact number) was provided at the bedside as a reminder, in case needed in the future.  Explained to son regarding[facility] CM services available for health management at home and he expressedunderstandingto seek referral from primary care provider to [facility] care management if deemed necessaryfor services in the future, if ever patient returns back home.  [facility] care management information provided for future needs that may  arise.   For additional questions please contact:  Edwena Felty A. Kaya Klausing, BSN, RN-BC [facility] PRIMARY CARE Navigator Cell: [phone]

## 2017-07-29 NOTE — Care Management Note (Signed)
Case Management Note  Patient Details  Name: Luis King MRN: [id number] Date of Birth: July 26, 1930  Subjective/Objective:    Pt in with acute metabolic encephalopathy. He is from home with his wife and son.                 Action/Plan: PT recommending SNF for rehab. CM spoke to patients son about SNF rehab. Pts son states his father has been to rehab in the past and did not participate as well as they would have liked. Son inquired about ALF. CM informed him that we do not discharge to new ALF not already arranged. Son voiced understanding. CM also offered [facility] services if they would like to take the patient home. Son is going to discuss d/c plans with his mother and they are to visit the hospital later today. CSW updated. CM following.  Expected Discharge Date:                  Expected Discharge Plan:  Skilled Nursing Facility  In-House Referral:  Clinical Social Work  Discharge planning Services  CM Consult  Post Acute Care Choice:    Choice offered to:     DME Arranged:    DME Agency:     HH Arranged:    [place] Agency:     Status of Service:  In process, will continue to follow  If discussed at Long Length of Stay Meetings, dates discussed:    Additional Comments:  Pollie Friar, RN 07/29/2017, 11:50 AM

## 2017-07-29 NOTE — Progress Notes (Signed)
  Speech Language Pathology Treatment: Dysphagia  Patient Details Name: Luis King MRN: [id number] DOB: 25-Apr-1930 Today's Date: 07/29/2017 Time: 6195-0932 SLP Time Calculation (min) (ACUTE ONLY): 20 min  Assessment / Plan / Recommendation Clinical Impression  Observed pt with a.m. meal; oral care completed by SLP prior to feeding. Pt tolerated puree diet with thin liquid with only mild oral residue noted 1x requireing max verbal cueing to clear. Pt subjectively controls thin liquid better via straw sips; no anterior spillage noted. No adverse reaction to meal presentation; total assist needed for feeding to elicit small bites/sips, to check for oral residue and cue for clearing of residue as needed. Recommend continuation of Dys 1 diet with thin liquids and meds crushed in puree given pt's cognitive status. Will continue to monitor for potential diet advancement.    HPI HPI: Luis King a 83 y.o.malewith medical history significant of Alzheimer's, DM, HTN. Patient brought in by son after a fall at home. Not injured and not in pain but son notes that over past 2 weeks has had gradual increased weakness, difficulty completing ADLs. Current problems include sepsis 2/2 UTI, acute metabolic encephalopathy.       SLP Plan  Continue with current plan of care       Recommendations  Diet recommendations: Dysphagia 1 (puree);Thin liquid Liquids provided via: Straw;Cup (Better with straw) Medication Administration: Crushed with puree Supervision: Full supervision/cueing for compensatory strategies Compensations: Small sips/bites;Minimize environmental distractions Postural Changes and/or Swallow Maneuvers: Seated upright 90 degrees;Upright 30-60 min after meal                Oral Care Recommendations: Oral care QID Follow up Recommendations: Skilled Nursing facility SLP Visit Diagnosis: Dysphagia, oral phase (R13.11) Plan: Continue with current plan of care       [place], Student SLP 07/29/2017, 9:35 AM

## 2017-07-29 NOTE — Progress Notes (Signed)
PROGRESS NOTE    Luis King  GXQ:[id number] DOB: 02-06-1930 DOA: 07/25/2017 PCP: Lucianne Lei, MD   Chief Complaint  Patient presents with  . Fall    Brief Narrative:  HPI On 07/26/2017 by Dr. Nikki Dom [name] a 83 y.o.malewith medical history significant of Alzheimer's, DM, HTN. Patient brought in by son after a fall at home. Not injured and not in pain but son notes that over past 2 weeks has had gradual increased weakness, difficulty completing ADLs.  Interim history Being treated for UTI.  Assessment & Plan   Sepsis secondary to urinary tract infection -Presented with fever, tachycardia, tachypnea -UA showed many bacteria, TNTC WBC, large leukocytes -Blood cultures show no growth -urine culture still not collected, called microbiology not able to add urine culture -Continue ceftriaxone -will send for repeat culture, not sure that it will be beneficial, however patient continues to be tachycardic  -will obtain CXR given that patient has some encephalopathy, ?aspiration   Acute metabolic encephalopathy -With underlying dementia at baseline -Suspect secondary to urinary tract infection CT head showed atrophy with chronic appearing small vessel ischemia. No acute intracranial abnormality. -will continue to monitor  Leukocytosis -developed after prednisone restarted -unsure why patient is on prednisone  Hypokalemia -replace and monitor BMP  Essential hypertension -Lisinopril, lasix, HCTZ held  -blood pressure currently stable -Continue to monitor and start IV medications as needed  Diabetes mellitus, type II -Continue insulin sliding scale CBG monitoring -70/30 held  Multinodular goiter -Noted on CT cervical spine, patient will need outpatient follow-up/workup  Deconditioning -PT consulted and recommended SNF  DVT Prophylaxis  Lovenox  Code Status: Full  Family Communication:  none at bedside, son via phone   Disposition Plan:  Admitted.  Dispo SNF  Consultants None  Procedures  None  Antibiotics   Anti-infectives    Start     Dose/Rate Route Frequency Ordered Stop   07/27/17 0530  cefTRIAXone (ROCEPHIN) 1 g in dextrose 5 % 50 mL IVPB     1 g 100 mL/hr over 30 Minutes Intravenous Every 24 hours 07/26/17 0541     07/26/17 0530  cefTRIAXone (ROCEPHIN) 1 g in dextrose 5 % 50 mL IVPB     1 g 100 mL/hr over 30 Minutes Intravenous  Once 07/26/17 0522 07/26/17 0631      Subjective:   Luis King seen and examined today. Patient with dementia. Has no complaints.   Objective:   Vitals:   07/28/17 2051 07/29/17 0128 07/29/17 0652 07/29/17 1026  BP: 95/70 99/67 115/71 102/72  Pulse: (!) 120 (!) 118 (!) 121 (!) 120  Resp: 20 18 18 20   Temp: (!) 101.3 F (38.5 C) (!) 100.4 F (38 C) (!) 101.3 F (38.5 C) 98.6 F (37 C)  TempSrc: Axillary Axillary Axillary Oral  SpO2: 98% 100% 94% 97%    Intake/Output Summary (Last 24 hours) at 07/29/17 1150 Last data filed at 07/29/17 0400  Gross per 24 hour  Intake           1332.5 ml  Output                0 ml  Net           1332.5 ml   There were no vitals filed for this visit.  Exam  General: Well developed, well nourished, NAD  HEENT: NCAT, mucous membranes dry  Cardiovascular: S1 S2 auscultated, no murmurs, RRR  Respiratory: Clear to auscultation bilaterally with equal chest rise  Abdomen: Soft,  nontender, nondistended, + bowel sounds  Extremities: warm dry without cyanosis clubbing or edema  Neuro: AAOx1 (self only), nonfocal, able to move all extremities   Data Reviewed: I have personally reviewed following labs and imaging studies  CBC:  Recent Labs Lab 07/26/17 0032 07/27/17 0313 07/28/17 0622 07/29/17 0815  WBC 5.2 8.0 13.2* 13.0*  NEUTROABS 3.1  --   --   --   HGB 13.4 13.5 16.2 14.4  HCT 41.1 40.4 47.5 42.6  MCV 89.7 89.0 89.3 89.5  PLT 118* 140* 129* 376   Basic Metabolic Panel:  Recent Labs Lab 07/26/17 0032  07/27/17 0313 07/28/17 0622 07/29/17 0815  NA 138 138 142 140  K 3.7 3.5 4.2 3.4*  CL 103 103 100* 107  CO2 25 25 23 24   GLUCOSE 149* 137* 195* 195*  BUN 10 8 10 20   CREATININE 1.02 1.03 1.13 1.12  CALCIUM 9.2 8.8* 8.6* 8.6*   GFR: CrCl cannot be calculated (Unknown ideal weight.). Liver Function Tests: No results for input(s): AST, ALT, ALKPHOS, BILITOT, PROT, ALBUMIN in the last 168 hours. No results for input(s): LIPASE, AMYLASE in the last 168 hours. No results for input(s): AMMONIA in the last 168 hours. Coagulation Profile: No results for input(s): INR, PROTIME in the last 168 hours. Cardiac Enzymes: No results for input(s): CKTOTAL, CKMB, CKMBINDEX, TROPONINI in the last 168 hours. BNP (last 3 results) No results for input(s): PROBNP in the last 8760 hours. HbA1C: No results for input(s): HGBA1C in the last 72 hours. CBG:  Recent Labs Lab 07/28/17 2122 07/29/17 0015 07/29/17 0344 07/29/17 0652 07/29/17 1123  GLUCAP 247* 202* 190* 202* 210*   Lipid Profile: No results for input(s): CHOL, HDL, LDLCALC, TRIG, CHOLHDL, LDLDIRECT in the last 72 hours. Thyroid Function Tests: No results for input(s): TSH, T4TOTAL, FREET4, T3FREE, THYROIDAB in the last 72 hours. Anemia Panel: No results for input(s): VITAMINB12, FOLATE, FERRITIN, TIBC, IRON, RETICCTPCT in the last 72 hours. Urine analysis:    Component Value Date/Time   COLORURINE YELLOW 07/25/2017 0450   APPEARANCEUR CLOUDY (A) 07/25/2017 0450   LABSPEC 1.018 07/25/2017 0450   PHURINE 5.0 07/25/2017 0450   GLUCOSEU NEGATIVE 07/25/2017 0450   HGBUR NEGATIVE 07/25/2017 0450   BILIRUBINUR NEGATIVE 07/25/2017 0450   KETONESUR 5 (A) 07/25/2017 0450   PROTEINUR 100 (A) 07/25/2017 0450   NITRITE NEGATIVE 07/25/2017 0450   LEUKOCYTESUR LARGE (A) 07/25/2017 0450   Sepsis Labs: @LABRCNTIP (procalcitonin:4,lacticidven:4)  ) Recent Results (from the past 240 hour(s))  Culture, blood (routine x 2)     Status: None  (Preliminary result)   Collection Time: 07/26/17  4:02 PM  Result Value Ref Range Status   Specimen Description BLOOD BLOOD LEFT HAND  Final   Special Requests IN PEDIATRIC BOTTLE Blood Culture adequate volume  Final   Culture NO GROWTH 2 DAYS  Final   Report Status PENDING  Incomplete  Culture, blood (routine x 2)     Status: None (Preliminary result)   Collection Time: 07/26/17  4:05 PM  Result Value Ref Range Status   Specimen Description BLOOD BLOOD LEFT HAND  Final   Special Requests IN PEDIATRIC BOTTLE Blood Culture adequate volume  Final   Culture NO GROWTH 2 DAYS  Final   Report Status PENDING  Incomplete      Radiology Studies: No results found.   Scheduled Meds: . enoxaparin (LOVENOX) injection  40 mg Subcutaneous Q24H  . insulin aspart  0-5 Units Subcutaneous QHS  . insulin aspart  0-9 Units Subcutaneous TID WC  . predniSONE  7.5 mg Oral Q breakfast   Continuous Infusions: . sodium chloride 1,000 mL (07/29/17 0022)  . cefTRIAXone (ROCEPHIN)  IV Stopped (07/29/17 0527)     LOS: 3 days   Time Spent in minutes   30 minutes  Terryl Niziolek D.O. on 07/29/2017 at 11:50 AM  Between 7am to 7pm - Pager - [phone]  After 7pm go to [email or web address] - password [email or web address]  And look for the night coverage person covering for me after hours  [facility] Office  [phone]

## 2017-07-30 ENCOUNTER — Encounter (HOSPITAL_COMMUNITY): Payer: Self-pay | Admitting: Radiology

## 2017-07-30 ENCOUNTER — Inpatient Hospital Stay (HOSPITAL_COMMUNITY): Payer: Medicare Other

## 2017-07-30 DIAGNOSIS — I10 Essential (primary) hypertension: Secondary | ICD-10-CM

## 2017-07-30 DIAGNOSIS — N390 Urinary tract infection, site not specified: Secondary | ICD-10-CM

## 2017-07-30 DIAGNOSIS — E119 Type 2 diabetes mellitus without complications: Secondary | ICD-10-CM

## 2017-07-30 DIAGNOSIS — G9341 Metabolic encephalopathy: Secondary | ICD-10-CM

## 2017-07-30 LAB — CBC
HCT: 42 % (ref 39.0–52.0)
HEMOGLOBIN: 13.7 g/dL (ref 13.0–17.0)
MCH: 29.1 pg (ref 26.0–34.0)
MCHC: 32.6 g/dL (ref 30.0–36.0)
MCV: 89.4 fL (ref 78.0–100.0)
PLATELETS: 171 10*3/uL (ref 150–400)
RBC: 4.7 MIL/uL (ref 4.22–5.81)
RDW: 13.2 % (ref 11.5–15.5)
WBC: 9.9 10*3/uL (ref 4.0–10.5)

## 2017-07-30 LAB — BASIC METABOLIC PANEL
ANION GAP: 12 (ref 5–15)
BUN: 21 mg/dL — ABNORMAL HIGH (ref 6–20)
CHLORIDE: 108 mmol/L (ref 101–111)
CO2: 23 mmol/L (ref 22–32)
Calcium: 8.5 mg/dL — ABNORMAL LOW (ref 8.9–10.3)
Creatinine, Ser: 1.01 mg/dL (ref 0.61–1.24)
GFR calc Af Amer: >60 mL/min (ref >60)
Glucose, Bld: 182 mg/dL — ABNORMAL HIGH (ref 65–99)
POTASSIUM: 3.5 mmol/L (ref 3.5–5.1)
SODIUM: 143 mmol/L (ref 135–145)

## 2017-07-30 LAB — GLUCOSE, CAPILLARY
GLUCOSE-CAPILLARY: 176 mg/dL — AB (ref 65–99)
GLUCOSE-CAPILLARY: 168 mg/dL — AB (ref 65–99)
Glucose-Capillary: 195 mg/dL — ABNORMAL HIGH (ref 65–99)
Glucose-Capillary: 157 mg/dL — ABNORMAL HIGH (ref 65–99)
Glucose-Capillary: 192 mg/dL — ABNORMAL HIGH (ref 65–99)

## 2017-07-30 LAB — T4, FREE: Free T4: 1.18 ng/dL — ABNORMAL HIGH (ref 0.61–1.12)

## 2017-07-30 LAB — TSH: TSH: 1.983 u[IU]/mL (ref 0.350–4.500)

## 2017-07-30 LAB — D-DIMER, QUANTITATIVE (NOT AT ARMC): D DIMER QUANT: 10.13 ug{FEU}/mL — AB (ref 0.00–0.50)

## 2017-07-30 MED ORDER — CIPROFLOXACIN IN D5W 400 MG/200ML IV SOLN: 400.0000 mg | INTRAVENOUS | Status: DC

## 2017-07-30 MED ORDER — CIPROFLOXACIN IN D5W 400 MG/200ML IV SOLN
400.0000 mg | Freq: Two times a day (BID) | INTRAVENOUS | Status: DC
  Administered 2017-07-30 – 2017-07-31 (×3): 400 mg via INTRAVENOUS
  Filled 2017-07-30 (×3): qty 200

## 2017-07-30 MED ORDER — IOPAMIDOL (ISOVUE-370) INJECTION 76%
INTRAVENOUS | Status: AC
  Administered 2017-07-30: 100 mL
  Filled 2017-07-30: qty 100

## 2017-07-30 NOTE — Progress Notes (Signed)
Patient was reported to have a temp over 100.3 I gave him a tylenol suppository will recheck temp before shift end.

## 2017-07-30 NOTE — Progress Notes (Signed)
[facility]                                                                              Patient Demographics  Luis King, is a 83 y.o. male, DOB - 07-09-1930, [id number]  Admit date - 07/25/2017   Admitting Physician Etta Quill, DO  Outpatient Primary MD for the patient is Lucianne Lei, MD  Outpatient specialists:   LOS - 4  days   Medical records reviewed and are as summarized below:    Chief Complaint  Patient presents with  . Fall       Brief summary   HPI On 07/26/2017 by Dr. Adline Mango [name] a 83 y.o.malewith medical history significant of Alzheimer's, DM, HTN. Patient brought in by son after a fall at home. Not injured and not in pain but son notes that over past 2 weeks has had gradual increased weakness, difficulty completing ADLs.  Patient was found to have UTI.    Assessment & Plan    Principal Problem:   Acute metabolic encephalopathy likely secondary to UTI, in the setting of underlying dementia -CT head showed atrophy with chronic appearing small vessel ischemia, no acute intracranial abnormality -Continue IV antibiotics for UTI, follow urine cultures  Active Problems: Sepsis secondary to UTI, acute lower UTI -Patient met sepsis criteria with fever, tachycardia, tachypnea, UA positive for UTI -Urine culture pending, blood cultures showed no growth so far -Chest x-ray showed no pneumonia -Continues to spike low-grade temp with tachycardia, on IV Rocephin, follow urine culture and sensitivities, change to IV cipro -Continue gentle hydration  tachycardia -Repeat EKG, rule out A. fib, d-dimer, TSH.  Asymptomatic    DM2 (diabetes mellitus, type 2) (HCC) -Continue sliding scale insulin, follow hemoglobin A1c     HTN (hypertension) -Currently BP stable, lisinopril, Lasix, HCTZ held  Multinodular goiter -Noted on CT cervical spine, follow TSH, free T4, T3 -Outpatient workup   Code Status: Full CODE  STATUS DVT Prophylaxis:  Lovenox Family Communication: Discussed in detail with the patient, all imaging results, lab results explained to the patient   Disposition Plan: Will need skilled nursing facility  Time Spent in minutes  25 minutes  Procedures:    Consultants:   None   Antimicrobials:   IV Rocephin 10/20   Medications  Scheduled Meds: . enoxaparin (LOVENOX) injection  40 mg Subcutaneous Q24H  . insulin aspart  0-5 Units Subcutaneous QHS  . insulin aspart  0-9 Units Subcutaneous TID WC  . predniSONE  7.5 mg Oral Q breakfast   Continuous Infusions: . sodium chloride 1,000 mL (07/30/17 0417)  . cefTRIAXone (ROCEPHIN)  IV Stopped (07/30/17 0502)   PRN Meds:.acetaminophen **OR** acetaminophen, ondansetron **OR** ondansetron (ZOFRAN) IV   Antibiotics   Anti-infectives    Start     Dose/Rate Route Frequency Ordered Stop   07/27/17 0530  cefTRIAXone (ROCEPHIN) 1 g in dextrose 5 % 50 mL IVPB     1 g 100 mL/hr over 30 Minutes Intravenous Every 24 hours 07/26/17 0541     07/26/17 0530  cefTRIAXone (ROCEPHIN) 1 g in dextrose 5 % 50 mL IVPB     1  g 100 mL/hr over 30 Minutes Intravenous  Once 07/26/17 0522 07/26/17 0631        Subjective:   Luis King was seen and examined today.  Has dementia, denies any specific complaints.  Low-grade temp, T-max 100 F.  Still having tachycardia.  Objective:   Vitals:   07/29/17 2021 07/30/17 0033 07/30/17 0543 07/30/17 0930  BP: 115/72 111/68 116/90 128/82  Pulse: (!) 119  (!) 122 (!) 120  Resp: 20 20 20 18   Temp: 98.7 F (37.1 C) 98.9 F (37.2 C) 100 F (37.8 C) 99.8 F (37.7 C)  TempSrc: Axillary Axillary Oral Axillary  SpO2: 96% 95% 97% 97%    Intake/Output Summary (Last 24 hours) at 07/30/17 1158 Last data filed at 07/30/17 0035  Gross per 24 hour  Intake               60 ml  Output              300 ml  Net             -240 ml     Wt Readings from Last 3 Encounters:  No data found for Wt      Exam  General: Alert and awake, NAD  Eyes:   HEENT:  Atraumatic, normocephalic  Cardiovascular: S1 S2 auscultated, tachycardia. Regular rate and rhythm.  Respiratory: Clear to auscultation bilaterally, no wheezing, rales or rhonchi  Gastrointestinal: Soft, nontender, nondistended, + bowel sounds  Ext: no pedal edema bilaterally  Neuro: no new deficit, moving all 4 extremities  Musculoskeletal: No digital cyanosis, clubbing  Skin: No rashes  Psych: confused, oriented to self   Data Reviewed:  I have personally reviewed following labs and imaging studies  Micro Results Recent Results (from the past 240 hour(s))  Culture, blood (routine x 2)     Status: None (Preliminary result)   Collection Time: 07/26/17  4:02 PM  Result Value Ref Range Status   Specimen Description BLOOD BLOOD LEFT HAND  Final   Special Requests IN PEDIATRIC BOTTLE Blood Culture adequate volume  Final   Culture NO GROWTH 4 DAYS  Final   Report Status PENDING  Incomplete  Culture, blood (routine x 2)     Status: None (Preliminary result)   Collection Time: 07/26/17  4:05 PM  Result Value Ref Range Status   Specimen Description BLOOD BLOOD LEFT HAND  Final   Special Requests IN PEDIATRIC BOTTLE Blood Culture adequate volume  Final   Culture NO GROWTH 4 DAYS  Final   Report Status PENDING  Incomplete    Radiology Reports Dg Chest 2 View  Result Date: 07/26/2017 CLINICAL DATA:  83 y/o  M; fall and altered mental status. EXAM: CHEST  2 VIEW COMPARISON:  None. FINDINGS: Normal cardiac silhouette given projection and technique. Low lung volumes accentuate pulmonary markings. No consolidation, effusion, or pneumothorax. No acute osseous abnormality is evident. IMPRESSION: Low lung volumes.  No acute pulmonary process identified. Electronically Signed   By: Kristine Garbe M.D.   On: 07/26/2017 00:15   Ct Head Wo Contrast  Result Date: 07/25/2017 CLINICAL DATA:  Minor head trauma.  Alzheimer's disease. Altered mental status. EXAM: CT HEAD WITHOUT CONTRAST CT CERVICAL SPINE WITHOUT CONTRAST TECHNIQUE: Multidetector CT imaging of the head and cervical spine was performed following the standard protocol without intravenous contrast. Multiplanar CT image reconstructions of the cervical spine were also generated. COMPARISON:  None. FINDINGS: CT HEAD FINDINGS BRAIN: There is sulcal and ventricular prominence consistent with  superficial and central atrophy. No intraparenchymal hemorrhage, mass effect nor midline shift. Periventricular and subcortical white matter hypodensities consistent with chronic small vessel ischemic disease are identified. Chronic appearing left colonic lacunar infarct. No acute large vascular territory infarcts. No abnormal extra-axial fluid collections. Basal cisterns are not effaced and midline. VASCULAR: Moderate calcific atherosclerosis of the carotid siphons. SKULL: No skull fracture. No significant scalp soft tissue swelling. SINUSES/ORBITS: The mastoid air-cells are clear. The included paranasal sinuses are well-aerated.The included ocular globes and orbital contents are non-suspicious. OTHER: None. CT CERVICAL SPINE FINDINGS Alignment: Maintained cervical lordosis. Intact craniocervical relationship and atlantodental interval. Skull base and vertebrae: No acute fracture. No primary bone lesion or focal pathologic process. The left C4 vertebral foramen is slightly wider than left likely developmental. Soft tissues and spinal canal: No prevertebral fluid or swelling. No visible canal hematoma. Disc levels: Moderate disc space narrowing C4-5 and C6-7 with small posterior marginal osteophytes. Mild disc space narrowing at C5-6. No jumped or perched facets. Partial ankylosis of the left C2-3 facet. Mild left C3-4 and bilateral C7-T1 facet arthropathy with joint space narrowing and sclerosis. Bilateral uncovertebral joint osteoarthritis with spurring noted at C4-5 and C6-7.  Right-sided uncovertebral spurring at C5-6. Upper chest:  Nonacute Other: The thyroid gland is enlarged, right lobe greater than left with heterogeneous appearing parenchyma. Small bilateral cystic nodules are noted, some of which may complex with proteinaceous material within. IMPRESSION: 1. Atrophy with chronic appearing small vessel ischemia. No acute intracranial abnormality. 2. Cervical spondylosis without acute cervical spine fracture or posttraumatic subluxation. 3. Heterogeneous enlargement of the thyroid gland consistent with multinodular goiter. Electronically Signed   By: Ashley Royalty M.D.   On: 07/25/2017 23:55   Ct Cervical Spine Wo Contrast  Result Date: 07/25/2017 CLINICAL DATA:  Minor head trauma. Alzheimer's disease. Altered mental status. EXAM: CT HEAD WITHOUT CONTRAST CT CERVICAL SPINE WITHOUT CONTRAST TECHNIQUE: Multidetector CT imaging of the head and cervical spine was performed following the standard protocol without intravenous contrast. Multiplanar CT image reconstructions of the cervical spine were also generated. COMPARISON:  None. FINDINGS: CT HEAD FINDINGS BRAIN: There is sulcal and ventricular prominence consistent with superficial and central atrophy. No intraparenchymal hemorrhage, mass effect nor midline shift. Periventricular and subcortical white matter hypodensities consistent with chronic small vessel ischemic disease are identified. Chronic appearing left colonic lacunar infarct. No acute large vascular territory infarcts. No abnormal extra-axial fluid collections. Basal cisterns are not effaced and midline. VASCULAR: Moderate calcific atherosclerosis of the carotid siphons. SKULL: No skull fracture. No significant scalp soft tissue swelling. SINUSES/ORBITS: The mastoid air-cells are clear. The included paranasal sinuses are well-aerated.The included ocular globes and orbital contents are non-suspicious. OTHER: None. CT CERVICAL SPINE FINDINGS Alignment: Maintained cervical  lordosis. Intact craniocervical relationship and atlantodental interval. Skull base and vertebrae: No acute fracture. No primary bone lesion or focal pathologic process. The left C4 vertebral foramen is slightly wider than left likely developmental. Soft tissues and spinal canal: No prevertebral fluid or swelling. No visible canal hematoma. Disc levels: Moderate disc space narrowing C4-5 and C6-7 with small posterior marginal osteophytes. Mild disc space narrowing at C5-6. No jumped or perched facets. Partial ankylosis of the left C2-3 facet. Mild left C3-4 and bilateral C7-T1 facet arthropathy with joint space narrowing and sclerosis. Bilateral uncovertebral joint osteoarthritis with spurring noted at C4-5 and C6-7. Right-sided uncovertebral spurring at C5-6. Upper chest:  Nonacute Other: The thyroid gland is enlarged, right lobe greater than left with heterogeneous appearing parenchyma. Small bilateral  cystic nodules are noted, some of which may complex with proteinaceous material within. IMPRESSION: 1. Atrophy with chronic appearing small vessel ischemia. No acute intracranial abnormality. 2. Cervical spondylosis without acute cervical spine fracture or posttraumatic subluxation. 3. Heterogeneous enlargement of the thyroid gland consistent with multinodular goiter. Electronically Signed   By: Ashley Royalty M.D.   On: 07/25/2017 23:55   Dg Chest Port 1 View  Result Date: 07/29/2017 CLINICAL DATA:  Confusion, altered mental status, possible sepsis EXAM: PORTABLE CHEST 1 VIEW COMPARISON:  Chest x-ray of 07/26/2017 FINDINGS: Minimally prominent markings at the lung bases most likely reflect basilar linear atelectasis. No pneumonia or effusion is seen. Mediastinal and hilar contours are unremarkable. The heart is mildly enlarged and stable. No bony abnormality is seen. IMPRESSION: Mild bibasilar linear atelectasis.  No definite active process. Electronically Signed   By: Ivar Drape M.D.   On: 07/29/2017 12:56     Lab Data:  CBC:  Recent Labs Lab 07/26/17 0032 07/27/17 0313 07/28/17 0622 07/29/17 0815 07/30/17 0630  WBC 5.2 8.0 13.2* 13.0* 9.9  NEUTROABS 3.1  --   --   --   --   HGB 13.4 13.5 16.2 14.4 13.7  HCT 41.1 40.4 47.5 42.6 42.0  MCV 89.7 89.0 89.3 89.5 89.4  PLT 118* 140* 129* 154 574   Basic Metabolic Panel:  Recent Labs Lab 07/26/17 0032 07/27/17 0313 07/28/17 0622 07/29/17 0815 07/30/17 0630  NA 138 138 142 140 143  K 3.7 3.5 4.2 3.4* 3.5  CL 103 103 100* 107 108  CO2 25 25 23 24 23   GLUCOSE 149* 137* 195* 195* 182*  BUN 10 8 10 20  21*  CREATININE 1.02 1.03 1.13 1.12 1.01  CALCIUM 9.2 8.8* 8.6* 8.6* 8.5*   GFR: CrCl cannot be calculated (Unknown ideal weight.). Liver Function Tests: No results for input(s): AST, ALT, ALKPHOS, BILITOT, PROT, ALBUMIN in the last 168 hours. No results for input(s): LIPASE, AMYLASE in the last 168 hours. No results for input(s): AMMONIA in the last 168 hours. Coagulation Profile: No results for input(s): INR, PROTIME in the last 168 hours. Cardiac Enzymes: No results for input(s): CKTOTAL, CKMB, CKMBINDEX, TROPONINI in the last 168 hours. BNP (last 3 results) No results for input(s): PROBNP in the last 8760 hours. HbA1C: No results for input(s): HGBA1C in the last 72 hours. CBG:  Recent Labs Lab 07/29/17 0652 07/29/17 1123 07/29/17 1638 07/29/17 2122 07/30/17 0603  GLUCAP 202* 210* 270* 201* 176*   Lipid Profile: No results for input(s): CHOL, HDL, LDLCALC, TRIG, CHOLHDL, LDLDIRECT in the last 72 hours. Thyroid Function Tests: No results for input(s): TSH, T4TOTAL, FREET4, T3FREE, THYROIDAB in the last 72 hours. Anemia Panel: No results for input(s): VITAMINB12, FOLATE, FERRITIN, TIBC, IRON, RETICCTPCT in the last 72 hours. Urine analysis:    Component Value Date/Time   COLORURINE YELLOW 07/25/2017 0450   APPEARANCEUR CLOUDY (A) 07/25/2017 0450   LABSPEC 1.018 07/25/2017 0450   PHURINE 5.0 07/25/2017 0450    GLUCOSEU NEGATIVE 07/25/2017 0450   HGBUR NEGATIVE 07/25/2017 0450   BILIRUBINUR NEGATIVE 07/25/2017 0450   KETONESUR 5 (A) 07/25/2017 0450   PROTEINUR 100 (A) 07/25/2017 0450   NITRITE NEGATIVE 07/25/2017 0450   LEUKOCYTESUR LARGE (A) 07/25/2017 0450     Lorrena Goranson M.D. [facility] 07/30/2017, 11:58 AM  Pager: [phone] Between 7am to 7pm - call Pager - [phone]  After 7pm go to [email or web address] - password [email or web address]  Call night coverage person covering after 7pm

## 2017-07-30 NOTE — Progress Notes (Signed)
Physical Therapy Treatment Patient Details Name: Luis King MRN: [id number] DOB: Oct 31, 1929 Today's Date: 07/30/2017    History of Present Illness Pt is an 83 y.o.malewith PMH of Alzheimer's, DM, HTN. Patient was brought to ED by son after a fall at home. Son noted that over past 2 weeks has had gradual increased weakness and difficulty completing ADLs.    PT Comments    Patient is making limited progress toward mobility goals. Needing increased physical assist but able to stand pivot to recliner today. Current POC remains appropriate. PT will continue to follow acutely and progress as tolerated.   Follow Up Recommendations  SNF;Supervision/Assistance - 24 hour     Equipment Recommendations   (TBD)    Recommendations for Other Services       Precautions / Restrictions Precautions Precautions: Fall Restrictions Weight Bearing Restrictions: No    Mobility  Bed Mobility Overal bed mobility: Needs Assistance Bed Mobility: Supine to Sit     Supine to sit: Mod assist;+2 for physical assistance     General bed mobility comments: pt was able to help more today when moving from supine to EOB. Needed mod A with bed pad to transition LEs and initally elevate trunk  Transfers Overall transfer level: Needs assistance   Transfers: Sit to/from Stand;Stand Pivot Transfers Sit to Stand: Max assist;+2 physical assistance Stand pivot transfers: Mod assist;+2 physical assistance       General transfer comment: max assist +2 with wrap around gait belt and bed pad, both knees blocked to attempt stand. Pt able to take a few steps toward recliner, but demonstrated difficulty advancing L LE  Ambulation/Gait             General Gait Details: unable at this time   Stairs            Wheelchair Mobility    Modified Rankin (Stroke Patients Only)       Balance Overall balance assessment: Needs assistance Sitting-balance support: Feet supported;No upper extremity  supported Sitting balance-Leahy Scale: Fair Sitting balance - Comments: pt able to maintain sitting EOB without support; not significantly challenged   Standing balance support: During functional activity;Bilateral upper extremity supported Standing balance-Leahy Scale: Poor Standing balance comment: with max assist +2 to come to stand for pivot to chair; unable to achieve without support                            Cognition Arousal/Alertness: Lethargic Behavior During Therapy: Anxious Overall Cognitive Status: Impaired/Different from baseline Area of Impairment: Orientation;Attention;Memory;Following commands;Safety/judgement;Awareness;Problem solving                 Orientation Level: Disoriented to;Person;Place;Time;Situation Current Attention Level: Focused Memory: Decreased short-term memory Following Commands: Follows one step commands inconsistently;Follows one step commands with increased time Safety/Judgement: Decreased awareness of deficits;Decreased awareness of safety Awareness: Intellectual Problem Solving: Slow processing;Decreased initiation;Difficulty sequencing;Requires verbal cues;Requires tactile cues General Comments: still difficult to arouse but once he started moving he was alert; was a little more vocal this session but difficult to understand      Exercises      General Comments        Pertinent Vitals/Pain Pain Assessment: No/denies pain    Home Living                      Prior Function            PT Goals (current goals can now  be found in the care plan section) Acute Rehab PT Goals Patient Stated Goal: none stated this session PT Goal Formulation: Patient unable to participate in goal setting Time For Goal Achievement: 08/11/17 Potential to Achieve Goals: Fair Progress towards PT goals: Progressing toward goals    Frequency    Min 3X/week      PT Plan Current plan remains appropriate    Co-evaluation               AM-PAC PT "6 Clicks" Daily Activity  Outcome Measure  Difficulty turning over in bed (including adjusting bedclothes, sheets and blankets)?: Unable Difficulty moving from lying on back to sitting on the side of the bed? : Unable Difficulty sitting down on and standing up from a chair with arms (e.g., wheelchair, bedside commode, etc,.)?: Unable Help needed moving to and from a bed to chair (including a wheelchair)?: Total Help needed walking in hospital room?: Total Help needed climbing 3-5 steps with a railing? : Total 6 Click Score: 6    End of Session Equipment Utilized During Treatment: Gait belt Activity Tolerance: Patient tolerated treatment well Patient left: in chair;with chair alarm set;with call bell/phone within reach Nurse Communication: Mobility status PT Visit Diagnosis: Muscle weakness (generalized) (M62.81);Other symptoms and signs involving the nervous system (R29.898);Other abnormalities of gait and mobility (R26.89)     Time: 1001-1020 PT Time Calculation (min) (ACUTE ONLY): 19 min  Charges:  $Therapeutic Activity: 8-22 mins                    G CodesSindy Guadeloupe, SPT [phone] office    Margarita Grizzle 07/30/2017, 12:00 PM

## 2017-07-30 NOTE — NC FL2 (Signed)
[place] LEVEL OF CARE SCREENING TOOL     IDENTIFICATION  Patient Name: Luis King Birthdate: 30-Oct-1929 Sex: male Admission Date (Current Location): 07/25/2017  [facility] and [place] Number:  Herbalist and Address:  [facility], [place] [address], [place], [place] [address]      Provider Number: [id number]  Attending Physician Name and Address:  Mendel Corning, MD  Relative Name and Phone Number:       Current Level of Care: Hospital Recommended Level of Care: [place] Prior Approval Number:    Date Approved/Denied:   PASRR Number: [id number]  Discharge Plan: SNF    Current Diagnoses: Patient Active Problem List   Diagnosis Date Noted  . Acute metabolic encephalopathy 08/65/7846  . Acute lower UTI 07/26/2017  . DM2 (diabetes mellitus, type 2) ([place]) 07/26/2017  . HTN (hypertension) 07/26/2017    Orientation RESPIRATION BLADDER Height & Weight     Self  Normal Incontinent, External catheter (catheter placed 07/26/17) Weight:   Height:     BEHAVIORAL SYMPTOMS/MOOD NEUROLOGICAL BOWEL NUTRITION STATUS      Incontinent Diet (puree)  AMBULATORY STATUS COMMUNICATION OF NEEDS Skin   Extensive Assist Verbally Normal                       Personal Care Assistance Level of Assistance  Bathing, Feeding, Dressing Bathing Assistance: Maximum assistance Feeding assistance: Maximum assistance Dressing Assistance: Maximum assistance     Functional Limitations Info             SPECIAL CARE FACTORS FREQUENCY  OT (By licensed OT), PT (By licensed PT), Speech therapy     PT Frequency: 5x/wk OT Frequency: 5x/wk     Speech Therapy Frequency: 5x/wk      Contractures      Additional Factors Info  Code Status, Allergies, Insulin Sliding Scale Code Status Info: Full Allergies Info: Shellfish Allergy   Insulin Sliding Scale Info: 0-9 Units 3x/day with meals; 0-5 units daily at bedtime        Current Medications (07/30/2017):  This is the current hospital active medication list Current Facility-Administered Medications  Medication Dose Route Frequency Provider Last Rate Last Dose  . 0.9 %  sodium chloride infusion   Intravenous Continuous Cristal Ford, DO 75 mL/hr at 07/30/17 0417 1,000 mL at 07/30/17 0417  . acetaminophen (TYLENOL) tablet 650 mg  650 mg Oral Q6H PRN Etta Quill, DO   650 mg at 07/27/17 1251   Or  . acetaminophen (TYLENOL) suppository 650 mg  650 mg Rectal Q6H PRN Etta Quill, DO   650 mg at 07/30/17 0522  . ciprofloxacin (CIPRO) IVPB 400 mg  400 mg Intravenous BID Rai, Ripudeep K, MD 200 mL/hr at 07/30/17 1514 400 mg at 07/30/17 1514  . enoxaparin (LOVENOX) injection 40 mg  40 mg Subcutaneous Q24H Jennette Kettle M, DO   40 mg at 07/29/17 1833  . insulin aspart (novoLOG) injection 0-5 Units  0-5 Units Subcutaneous QHS Eulogio Bear U, DO   2 Units at 07/29/17 2154  . insulin aspart (novoLOG) injection 0-9 Units  0-9 Units Subcutaneous TID WC Vann, Jessica U, DO   2 Units at 07/30/17 1259  . ondansetron (ZOFRAN) tablet 4 mg  4 mg Oral Q6H PRN Etta Quill, DO       Or  . ondansetron [facility]) injection 4 mg  4 mg Intravenous Q6H PRN Etta Quill, DO      .  predniSONE (DELTASONE) tablet 7.5 mg  7.5 mg Oral Q breakfast Eulogio Bear U, DO   7.5 mg at 07/30/17 1101     Discharge Medications: Please see discharge summary for a list of discharge medications.  Relevant Imaging Results:  Relevant Lab Results:   Additional Information SS#: [id number]  Geralynn Ochs, LCSW

## 2017-07-30 NOTE — Progress Notes (Signed)
Physical Therapy Treatment Patient Details Name: Luis King MRN: [id number] DOB: 03-15-1930 Today's Date: 07/30/2017    History of Present Illness Pt is an 83 y.o.malewith PMH of Alzheimer's, DM, HTN. Patient was brought to ED by son after a fall at home. Son noted that over past 2 weeks has had gradual increased weakness and difficulty completing ADLs.    PT Comments    Return session with patient now that he is more alert, assisted patient from chair to bed. Patient with some resistance to movement. Increased time and effort +2 max assist for all movement. SNF remains appropriate.   Follow Up Recommendations  SNF;Supervision/Assistance - 24 hour     Equipment Recommendations   (TBD)    Recommendations for Other Services       Precautions / Restrictions Precautions Precautions: Fall    Mobility  Bed Mobility Overal bed mobility: Needs Assistance Bed Mobility: Rolling;Sit to Supine Rolling: Mod assist;+2 for physical assistance     Sit to supine: Max assist;+2 for physical assistance   General bed mobility comments: moderate assist to roll to left side with 2 person assist. Max assist to elevate LEs and return to supine in bed  Transfers Overall transfer level: Needs assistance   Transfers: Sit to/from Stand;Stand Pivot Transfers Sit to Stand: Max assist;+2 physical assistance Stand pivot transfers: Max assist;+2 physical assistance       General transfer comment: Face to face with gait belt and chuck pad, increased effort to elevate to standing, patient with some resistance to movement  Ambulation/Gait                 Stairs            Wheelchair Mobility    Modified Rankin (Stroke Patients Only)       Balance Overall balance assessment: Needs assistance Sitting-balance support: Feet supported;No upper extremity supported Sitting balance-Leahy Scale: Fair Sitting balance - Comments: pt able to maintain sitting EOB without support; not  significantly challenged   Standing balance support: During functional activity;Bilateral upper extremity supported Standing balance-Leahy Scale: Poor Standing balance comment: Max assist, unablet o reach complete upright position                            Cognition Arousal/Alertness: Awake/alert Behavior During Therapy: Anxious Overall Cognitive Status: Impaired/Different from baseline Area of Impairment: Orientation;Attention;Memory;Following commands;Safety/judgement;Awareness;Problem solving                 Orientation Level: Disoriented to;Person;Place;Time;Situation Current Attention Level: Focused Memory: Decreased short-term memory Following Commands: Follows one step commands inconsistently;Follows one step commands with increased time Safety/Judgement: Decreased awareness of deficits;Decreased awareness of safety Awareness: Intellectual Problem Solving: Slow processing;Decreased initiation;Difficulty sequencing;Requires verbal cues;Requires tactile cues General Comments: more alert but remains confused throughout session      Exercises      General Comments        Pertinent Vitals/Pain Pain Assessment: Faces Faces Pain Scale: Hurts a little bit Pain Intervention(s): Monitored during session    Home Living                      Prior Function            PT Goals (current goals can now be found in the care plan section) Progress towards PT goals: Progressing toward goals    Frequency    Min 3X/week      PT Plan Current plan remains appropriate  Co-evaluation              AM-PAC PT "6 Clicks" Daily Activity  Outcome Measure  Difficulty turning over in bed (including adjusting bedclothes, sheets and blankets)?: Unable Difficulty moving from lying on back to sitting on the side of the bed? : Unable Difficulty sitting down on and standing up from a chair with arms (e.g., wheelchair, bedside commode, etc,.)?:  Unable Help needed moving to and from a bed to chair (including a wheelchair)?: Total Help needed walking in hospital room?: Total Help needed climbing 3-5 steps with a railing? : Total 6 Click Score: 6    End of Session Equipment Utilized During Treatment: Gait belt Activity Tolerance: Patient tolerated treatment well Patient left: in bed;with bed alarm set;with call bell/phone within reach Nurse Communication: Mobility status;Need for lift equipment PT Visit Diagnosis: Muscle weakness (generalized) (M62.81);Other symptoms and signs involving the nervous system (R29.898);Other abnormalities of gait and mobility (R26.89)     Time: 9211-9417 PT Time Calculation (min) (ACUTE ONLY): 16 min  Charges:  $Therapeutic Activity: 8-22 mins                    G Codes:       Alben Deeds, PT DPT  Board Certified Neurologic Specialist [place] 07/30/2017, 4:00 PM

## 2017-07-30 NOTE — Care Management Important Message (Signed)
Important Message  Patient Details  Name: Netanel Yannuzzi MRN: [id number] Date of Birth: Feb 01, 1930   Medicare Important Message Given:  Yes    Kateena Degroote 07/30/2017, 11:29 AM

## 2017-07-31 LAB — URINE CULTURE

## 2017-07-31 LAB — GLUCOSE, CAPILLARY
GLUCOSE-CAPILLARY: 186 mg/dL — AB (ref 65–99)
GLUCOSE-CAPILLARY: 248 mg/dL — AB (ref 65–99)
Glucose-Capillary: 223 mg/dL — ABNORMAL HIGH (ref 65–99)

## 2017-07-31 LAB — BASIC METABOLIC PANEL
ANION GAP: 16 — AB (ref 5–15)
BUN: 19 mg/dL (ref 6–20)
CHLORIDE: 110 mmol/L (ref 101–111)
CO2: 19 mmol/L — ABNORMAL LOW (ref 22–32)
Calcium: 8.3 mg/dL — ABNORMAL LOW (ref 8.9–10.3)
Creatinine, Ser: 0.98 mg/dL (ref 0.61–1.24)
Glucose, Bld: 177 mg/dL — ABNORMAL HIGH (ref 65–99)
POTASSIUM: 4.7 mmol/L (ref 3.5–5.1)
SODIUM: 145 mmol/L (ref 135–145)

## 2017-07-31 LAB — CBC
HCT: 41.5 % (ref 39.0–52.0)
Hemoglobin: 13.8 g/dL (ref 13.0–17.0)
MCH: 29.9 pg (ref 26.0–34.0)
MCHC: 33.3 g/dL (ref 30.0–36.0)
MCV: 89.8 fL (ref 78.0–100.0)
PLATELETS: 159 10*3/uL (ref 150–400)
RBC: 4.62 MIL/uL (ref 4.22–5.81)
RDW: 13.5 % (ref 11.5–15.5)
WBC: 8.8 10*3/uL (ref 4.0–10.5)

## 2017-07-31 LAB — CULTURE, BLOOD (ROUTINE X 2)
Culture: NO GROWTH
Culture: NO GROWTH
Special Requests: ADEQUATE
Special Requests: ADEQUATE

## 2017-07-31 LAB — T3: T3 TOTAL: 68 ng/dL — AB (ref 71–180)

## 2017-07-31 MED ORDER — METOPROLOL TARTRATE 12.5 MG HALF TABLET
12.5000 mg | ORAL_TABLET | Freq: Two times a day (BID) | ORAL | Status: DC
  Administered 2017-07-31 – 2017-08-01 (×3): 12.5 mg via ORAL
  Filled 2017-07-31 (×3): qty 1

## 2017-07-31 MED ORDER — AMPICILLIN 500 MG PO CAPS
500.0000 mg | ORAL_CAPSULE | Freq: Three times a day (TID) | ORAL | Status: DC
  Administered 2017-07-31 – 2017-08-01 (×4): 500 mg via ORAL
  Filled 2017-07-31 (×6): qty 1

## 2017-07-31 MED ORDER — AMPICILLIN 250 MG PO CAPS: 250.0000 mg | ORAL_CAPSULE | Freq: Four times a day (QID) | ORAL | Status: DC

## 2017-07-31 NOTE — Progress Notes (Signed)
  Speech Language Pathology Treatment: Dysphagia  Patient Details Name: Sabian Kuba MRN: [id number] DOB: 1930-01-28 Today's Date: 07/31/2017 Time: 0177-9390 SLP Time Calculation (min) (ACUTE ONLY): 10 min  Assessment / Plan / Recommendation Clinical Impression  Pt with inattention, max verbal/tactile cues required for participation.  Student nurse reports frequent oral holding during meals today, requiring manual removal of solids from mouth.  Dysphagia 1 will be safest/easiest diet for now - his behaviors while eating are characteristic of dementia-related dysphagia.  Pt demonstrates adequate toleration of thin liquids; no overt s/s of aspiration.  Notes indicate pt may D/C to SNF tomorrow - continue current diet at D/C.   HPI HPI: Clance Baquero a 83 y.o.malewith medical history significant of Alzheimer's, DM, HTN. Patient brought in by son after a fall at home. Not injured and not in pain but son notes that over past 2 weeks has had gradual increased weakness, difficulty completing ADLs. Current problems include sepsis 2/2 UTI, acute metabolic encephalopathy.       SLP Plan          Recommendations  Diet recommendations: Dysphagia 1 (puree);Thin liquid Liquids provided via: Straw;Cup Medication Administration: Crushed with puree Supervision: Full supervision/cueing for compensatory strategies Compensations: Small sips/bites;Minimize environmental distractions Postural Changes and/or Swallow Maneuvers: Seated upright 90 degrees;Upright 30-60 min after meal                Oral Care Recommendations: Oral care QID Follow up Recommendations: Skilled Nursing facility SLP Visit Diagnosis: Dysphagia, oral phase (R13.11)       GO                Juan Quam Laurice 07/31/2017, 3:56 PM

## 2017-07-31 NOTE — Progress Notes (Signed)
[facility]                                                                              Patient Demographics  Luis King, is a 83 y.o. male, DOB - [birth year], HUD:[id number]  Admit date - 07/25/2017   Admitting Physician Etta Quill, DO  Outpatient Primary MD for the patient is Lucianne Lei, MD  Outpatient specialists:   LOS - 5  days   Medical records reviewed and are as summarized below:    Chief Complaint  Patient presents with  . Fall       Brief summary   HPI On 07/26/2017 by Dr. Adline Mango [name] a 83 y.o.malewith medical history significant of Alzheimer's, DM, HTN. Patient brought in by son after a fall at home. Not injured and not in pain but son notes that over past 2 weeks has had gradual increased weakness, difficulty completing ADLs.  Patient was found to have UTI.    Assessment & Plan    Principal Problem:   Acute metabolic encephalopathy likely secondary to UTI, in the setting of underlying dementia -CT head showed atrophy with chronic appearing small vessel ischemia, no acute intracranial abnormality -Urine culture showed Enterococcus faecalis, changed antibiotics to ampicillin oral  Active Problems: Sepsis secondary to UTI, acute lower UTI -Patient met sepsis criteria with fever, tachycardia, tachypnea, UA positive for UTI -Blood cultures negative so far -Chest x-ray showed no pneumonia -Urine culture showed 70,000 colonies of Enterococcus faecalis, antibiotics changed to ampicillin for 7 days -Continue gentle hydration  tachycardia -EKG showed sinus tachycardia, d-dimer elevated 10.1, hence CT angiogram of the chest was obtained, showed no PE, subsegmental consolidative changes at the left lung base may represent atelectasis versus infiltrate.  Right hilar adenopathy of indeterminate etiology.  Incidental finding of right thyroid gland nodule. -No palpitations, placed on low-dose metoprolol -TSH 1.98    DM2  (diabetes mellitus, type 2) (HCC) -Continue sliding scale insulin, follow hemoglobin A1c     HTN (hypertension) -Currently BP stable, lisinopril, Lasix, HCTZ held  Multinodular goiter -Noted on CT cervical spine, TSH 1.98, free T4 1.1, T3 68 -Outpatient workup   Code Status: Full CODE STATUS DVT Prophylaxis:  Lovenox Family Communication: Discussed in detail with the patient, all imaging results, lab results explained to the patient   Disposition Plan: Will need skilled nursing facility, possible DC in a.m.  Time Spent in minutes  25 minutes  Procedures:    Consultants:   None   Antimicrobials:   IV Rocephin 10/20   Medications  Scheduled Meds: . ampicillin  250 mg Oral Q6H  . enoxaparin (LOVENOX) injection  40 mg Subcutaneous Q24H  . insulin aspart  0-5 Units Subcutaneous QHS  . insulin aspart  0-9 Units Subcutaneous TID WC  . predniSONE  7.5 mg Oral Q breakfast   Continuous Infusions: . sodium chloride 75 mL/hr at 07/30/17 2142   PRN Meds:.acetaminophen **OR** acetaminophen, ondansetron **OR** ondansetron (ZOFRAN) IV   Antibiotics   Anti-infectives    Start     Dose/Rate Route Frequency Ordered Stop   07/31/17 1315  ampicillin (PRINCIPEN) capsule 250 mg  250 mg Oral Every 6 hours 07/31/17 1311     07/30/17 1400  ciprofloxacin (CIPRO) IVPB 400 mg  Status:  Discontinued     400 mg 200 mL/hr over 60 Minutes Intravenous Every 24 hours 07/30/17 1210 07/30/17 1212   07/30/17 1300  ciprofloxacin (CIPRO) IVPB 400 mg  Status:  Discontinued     400 mg 200 mL/hr over 60 Minutes Intravenous 2 times daily 07/30/17 1212 07/31/17 1311   07/27/17 0530  cefTRIAXone (ROCEPHIN) 1 g in dextrose 5 % 50 mL IVPB  Status:  Discontinued     1 g 100 mL/hr over 30 Minutes Intravenous Every 24 hours 07/26/17 0541 07/30/17 1210   07/26/17 0530  cefTRIAXone (ROCEPHIN) 1 g in dextrose 5 % 50 mL IVPB     1 g 100 mL/hr over 30 Minutes Intravenous  Once 07/26/17 0522 07/26/17 0631          Subjective:   [name] was seen and examined today.  Has dementia, denies any specific complaints, no fevers, no acute events overnight.   Objective:   Vitals:   07/31/17 0138 07/31/17 0500 07/31/17 0830 07/31/17 0938  BP:  127/74 112/86 133/86  Pulse:  (!) 107 (!) 116 (!) 120  Resp:  20 18 18   Temp:  98.3 F (36.8 C) 99 F (37.2 C) 98.6 F (37 C)  TempSrc:  Axillary Axillary Axillary  SpO2:  98% 100% 100%  Weight: 72.6 kg (160 lb)       Intake/Output Summary (Last 24 hours) at 07/31/17 1312 Last data filed at 07/31/17 0400  Gross per 24 hour  Intake             1420 ml  Output              200 ml  Net             1220 ml     Wt Readings from Last 3 Encounters:  07/31/17 72.6 kg (160 lb)     Exam General: Alert and awake, dementia  Eyes: PERRLA, EOMI HEENT:   Cardiovascular: S1 S2 auscultated, sinus tachycardia no pedal edema b/l Respiratory: Clear to auscultation bilaterally, no wheezing, rales or rhonchi Gastrointestinal: Soft, nontender, nondistended, + bowel sounds Ext: no pedal edema bilaterally Neuro: no new deficits Musculoskeletal: No digital cyanosis, clubbing Skin: No rashes Psych: alert and awake, dementia     Data Reviewed:  I have personally reviewed following labs and imaging studies  Micro Results Recent Results (from the past 240 hour(s))  Culture, blood (routine x 2)     Status: None (Preliminary result)   Collection Time: 07/26/17  4:02 PM  Result Value Ref Range Status   Specimen Description BLOOD BLOOD LEFT HAND  Final   Special Requests IN PEDIATRIC BOTTLE Blood Culture adequate volume  Final   Culture NO GROWTH 4 DAYS  Final   Report Status PENDING  Incomplete  Culture, blood (routine x 2)     Status: None (Preliminary result)   Collection Time: 07/26/17  4:05 PM  Result Value Ref Range Status   Specimen Description BLOOD BLOOD LEFT HAND  Final   Special Requests IN PEDIATRIC BOTTLE Blood Culture adequate volume   Final   Culture NO GROWTH 4 DAYS  Final   Report Status PENDING  Incomplete  Urine Culture     Status: Abnormal   Collection Time: 07/29/17  1:00 PM  Result Value Ref Range Status   Specimen Description URINE, CLEAN CATCH  Final  Special Requests NONE  Final   Culture 70,000 COLONIES/mL ENTEROCOCCUS FAECALIS (A)  Final   Report Status 07/31/2017 FINAL  Final   Organism ID, Bacteria ENTEROCOCCUS FAECALIS (A)  Final      Susceptibility   Enterococcus faecalis - MIC*    AMPICILLIN <=2 SENSITIVE Sensitive     LEVOFLOXACIN 0.25 SENSITIVE Sensitive     NITROFURANTOIN <=16 SENSITIVE Sensitive     VANCOMYCIN <=0.5 SENSITIVE Sensitive     * 70,000 COLONIES/mL ENTEROCOCCUS FAECALIS    Radiology Reports Dg Chest 2 View  Result Date: 07/26/2017 CLINICAL DATA:  83 y/o  M; fall and altered mental status. EXAM: CHEST  2 VIEW COMPARISON:  None. FINDINGS: Normal cardiac silhouette given projection and technique. Low lung volumes accentuate pulmonary markings. No consolidation, effusion, or pneumothorax. No acute osseous abnormality is evident. IMPRESSION: Low lung volumes.  No acute pulmonary process identified. Electronically Signed   By: Kristine Garbe M.D.   On: 07/26/2017 00:15   Ct Head Wo Contrast  Result Date: 07/25/2017 CLINICAL DATA:  Minor head trauma. Alzheimer's disease. Altered mental status. EXAM: CT HEAD WITHOUT CONTRAST CT CERVICAL SPINE WITHOUT CONTRAST TECHNIQUE: Multidetector CT imaging of the head and cervical spine was performed following the standard protocol without intravenous contrast. Multiplanar CT image reconstructions of the cervical spine were also generated. COMPARISON:  None. FINDINGS: CT HEAD FINDINGS BRAIN: There is sulcal and ventricular prominence consistent with superficial and central atrophy. No intraparenchymal hemorrhage, mass effect nor midline shift. Periventricular and subcortical white matter hypodensities consistent with chronic small vessel  ischemic disease are identified. Chronic appearing left colonic lacunar infarct. No acute large vascular territory infarcts. No abnormal extra-axial fluid collections. Basal cisterns are not effaced and midline. VASCULAR: Moderate calcific atherosclerosis of the carotid siphons. SKULL: No skull fracture. No significant scalp soft tissue swelling. SINUSES/ORBITS: The mastoid air-cells are clear. The included paranasal sinuses are well-aerated.The included ocular globes and orbital contents are non-suspicious. OTHER: None. CT CERVICAL SPINE FINDINGS Alignment: Maintained cervical lordosis. Intact craniocervical relationship and atlantodental interval. Skull base and vertebrae: No acute fracture. No primary bone lesion or focal pathologic process. The left C4 vertebral foramen is slightly wider than left likely developmental. Soft tissues and spinal canal: No prevertebral fluid or swelling. No visible canal hematoma. Disc levels: Moderate disc space narrowing C4-5 and C6-7 with small posterior marginal osteophytes. Mild disc space narrowing at C5-6. No jumped or perched facets. Partial ankylosis of the left C2-3 facet. Mild left C3-4 and bilateral C7-T1 facet arthropathy with joint space narrowing and sclerosis. Bilateral uncovertebral joint osteoarthritis with spurring noted at C4-5 and C6-7. Right-sided uncovertebral spurring at C5-6. Upper chest:  Nonacute Other: The thyroid gland is enlarged, right lobe greater than left with heterogeneous appearing parenchyma. Small bilateral cystic nodules are noted, some of which may complex with proteinaceous material within. IMPRESSION: 1. Atrophy with chronic appearing small vessel ischemia. No acute intracranial abnormality. 2. Cervical spondylosis without acute cervical spine fracture or posttraumatic subluxation. 3. Heterogeneous enlargement of the thyroid gland consistent with multinodular goiter. Electronically Signed   By: Ashley Royalty M.D.   On: 07/25/2017 23:55   Ct  Angio Chest Pe W Or Wo Contrast  Result Date: 07/30/2017 CLINICAL DATA:  83 year old male with positive D-dimer. Evaluate for pulmonary embolus. EXAM: CT ANGIOGRAPHY CHEST WITH CONTRAST TECHNIQUE: Multidetector CT imaging of the chest was performed using the standard protocol during bolus administration of intravenous contrast. Multiplanar CT image reconstructions and MIPs were obtained to evaluate the vascular  anatomy. CONTRAST:  70 cc Isovue 370 COMPARISON:  Chest radiograph dated 07/29/2017 FINDINGS: Cardiovascular: There is mild cardiomegaly. No pericardial effusion. There is coronary vascular calcification involving the left main, lad, and left circumflex artery. There is mild atherosclerotic calcification of the thoracic aorta. No aneurysmal dilatation or evidence of dissection. Evaluation of the aorta is however limited due to suboptimal opacification and timing of the contrast. There is mild dilatation of the central pulmonary artery is suggestive of underlying pulmonary hypertension. There is no CT evidence of pulmonary embolism. A linear hypodense arm band crossing over a right upper lobe segmental branch (series 9, image 168) likely represents scarring. Mediastinum/Nodes: Right hilar adenopathy measure 1.9 cm in short axis. The right paratracheal lymph node measures 12 mm in short axis. The esophagus is grossly unremarkable. Right thyroid nodule or conglomerate of adjacent nodules noted which are poorly delineated. Further evaluation with thyroid ultrasound on a nonemergent basis recommended. Lungs/Pleura: There is subsegmental consolidative changes at the left lung base which may represent atelectasis versus infiltrate. Underlying mass is not entirely excluded. Correlation with clinical exam and follow-up to resolution recommended. There is no pleural effusion or pneumothorax. The central airways are patent. Upper Abdomen: No acute abnormality. Musculoskeletal: Degenerative changes of the spine. No  acute osseous pathology. Review of the MIP images confirms the above findings. IMPRESSION: 1. No CT evidence of pulmonary embolism. 2. Subsegmental consolidative changes at the left lung base may represent atelectasis versus infiltrate. Correlation with clinical exam and follow-up to resolution recommended. 3. Right hilar adenopathy of indeterminate etiology. Although findings may be reactive, neoplastic process is not excluded. Clinical correlation and follow-up recommended. 4. Mild cardiomegaly. 5. Mildly dilated central pulmonary vasculature suggestive of underlying pulmonary hypertension. 6. Coronary vascular calcification as well as atherosclerotic disease of the aorta. 7. **An incidental finding of potential clinical significance has been found. Right thyroid gland nodule. Further evaluation with nonemergent ultrasound recommended.** Electronically Signed   By: Anner Crete M.D.   On: 07/30/2017 20:48   Ct Cervical Spine Wo Contrast  Result Date: 07/25/2017 CLINICAL DATA:  Minor head trauma. Alzheimer's disease. Altered mental status. EXAM: CT HEAD WITHOUT CONTRAST CT CERVICAL SPINE WITHOUT CONTRAST TECHNIQUE: Multidetector CT imaging of the head and cervical spine was performed following the standard protocol without intravenous contrast. Multiplanar CT image reconstructions of the cervical spine were also generated. COMPARISON:  None. FINDINGS: CT HEAD FINDINGS BRAIN: There is sulcal and ventricular prominence consistent with superficial and central atrophy. No intraparenchymal hemorrhage, mass effect nor midline shift. Periventricular and subcortical white matter hypodensities consistent with chronic small vessel ischemic disease are identified. Chronic appearing left colonic lacunar infarct. No acute large vascular territory infarcts. No abnormal extra-axial fluid collections. Basal cisterns are not effaced and midline. VASCULAR: Moderate calcific atherosclerosis of the carotid siphons. SKULL: No  skull fracture. No significant scalp soft tissue swelling. SINUSES/ORBITS: The mastoid air-cells are clear. The included paranasal sinuses are well-aerated.The included ocular globes and orbital contents are non-suspicious. OTHER: None. CT CERVICAL SPINE FINDINGS Alignment: Maintained cervical lordosis. Intact craniocervical relationship and atlantodental interval. Skull base and vertebrae: No acute fracture. No primary bone lesion or focal pathologic process. The left C4 vertebral foramen is slightly wider than left likely developmental. Soft tissues and spinal canal: No prevertebral fluid or swelling. No visible canal hematoma. Disc levels: Moderate disc space narrowing C4-5 and C6-7 with small posterior marginal osteophytes. Mild disc space narrowing at C5-6. No jumped or perched facets. Partial ankylosis of the left C2-3 facet.  Mild left C3-4 and bilateral C7-T1 facet arthropathy with joint space narrowing and sclerosis. Bilateral uncovertebral joint osteoarthritis with spurring noted at C4-5 and C6-7. Right-sided uncovertebral spurring at C5-6. Upper chest:  Nonacute Other: The thyroid gland is enlarged, right lobe greater than left with heterogeneous appearing parenchyma. Small bilateral cystic nodules are noted, some of which may complex with proteinaceous material within. IMPRESSION: 1. Atrophy with chronic appearing small vessel ischemia. No acute intracranial abnormality. 2. Cervical spondylosis without acute cervical spine fracture or posttraumatic subluxation. 3. Heterogeneous enlargement of the thyroid gland consistent with multinodular goiter. Electronically Signed   By: Ashley Royalty M.D.   On: 07/25/2017 23:55   Dg Chest Port 1 View  Result Date: 07/29/2017 CLINICAL DATA:  Confusion, altered mental status, possible sepsis EXAM: PORTABLE CHEST 1 VIEW COMPARISON:  Chest x-ray of 07/26/2017 FINDINGS: Minimally prominent markings at the lung bases most likely reflect basilar linear atelectasis. No  pneumonia or effusion is seen. Mediastinal and hilar contours are unremarkable. The heart is mildly enlarged and stable. No bony abnormality is seen. IMPRESSION: Mild bibasilar linear atelectasis.  No definite active process. Electronically Signed   By: Ivar Drape M.D.   On: 07/29/2017 12:56    Lab Data:  CBC:  Recent Labs Lab 07/26/17 0032 07/27/17 0045 07/28/17 0622 07/29/17 0815 07/30/17 0630 07/31/17 0851  WBC 5.2 8.0 13.2* 13.0* 9.9 8.8  NEUTROABS 3.1  --   --   --   --   --   HGB 13.4 13.5 16.2 14.4 13.7 13.8  HCT 41.1 40.4 47.5 42.6 42.0 41.5  MCV 89.7 89.0 89.3 89.5 89.4 89.8  PLT 118* 140* 129* 154 171 997   Basic Metabolic Panel:  Recent Labs Lab 07/27/17 0313 07/28/17 0622 07/29/17 0815 07/30/17 0630 07/31/17 0608  NA 138 142 140 143 145  K 3.5 4.2 3.4* 3.5 4.7  CL 103 100* 107 108 110  CO2 25 23 24 23  19*  GLUCOSE 137* 195* 195* 182* 177*  BUN 8 10 20  21* 19  CREATININE 1.03 1.13 1.12 1.01 0.98  CALCIUM 8.8* 8.6* 8.6* 8.5* 8.3*   GFR: CrCl cannot be calculated (Unknown ideal weight.). Liver Function Tests: No results for input(s): AST, ALT, ALKPHOS, BILITOT, PROT, ALBUMIN in the last 168 hours. No results for input(s): LIPASE, AMYLASE in the last 168 hours. No results for input(s): AMMONIA in the last 168 hours. Coagulation Profile: No results for input(s): INR, PROTIME in the last 168 hours. Cardiac Enzymes: No results for input(s): CKTOTAL, CKMB, CKMBINDEX, TROPONINI in the last 168 hours. BNP (last 3 results) No results for input(s): PROBNP in the last 8760 hours. HbA1C: No results for input(s): HGBA1C in the last 72 hours. CBG:  Recent Labs Lab 07/30/17 1649 07/30/17 1929 07/30/17 2130 07/31/17 0640 07/31/17 1219  GLUCAP 195* 157* 192* 186* 248*   Lipid Profile: No results for input(s): CHOL, HDL, LDLCALC, TRIG, CHOLHDL, LDLDIRECT in the last 72 hours. Thyroid Function Tests:  Recent Labs  07/30/17 1219  TSH 1.983  FREET4 1.18*    Anemia Panel: No results for input(s): VITAMINB12, FOLATE, FERRITIN, TIBC, IRON, RETICCTPCT in the last 72 hours. Urine analysis:    Component Value Date/Time   COLORURINE YELLOW 07/25/2017 0450   APPEARANCEUR CLOUDY (A) 07/25/2017 0450   LABSPEC 1.018 07/25/2017 0450   PHURINE 5.0 07/25/2017 [place] 07/25/2017 0450   HGBUR NEGATIVE 07/25/2017 [place] NEGATIVE 07/25/2017 0450   KETONESUR 5 (A) 07/25/2017 0450  PROTEINUR 100 (A) 07/25/2017 0450   NITRITE NEGATIVE 07/25/2017 0450   LEUKOCYTESUR LARGE (A) 07/25/2017 0450     Varvara Legault M.D. [facility] 07/31/2017, 1:12 PM  Pager: [phone] Between 7am to 7pm - call Pager - [phone]  After 7pm go to [email or web address] - password [email or web address]  Call night coverage person covering after 7pm

## 2017-07-31 NOTE — Progress Notes (Signed)
CSW spoke with pt son- he prefers [facility]- per MD note plan for DC tomorrow- facility updated  CSW will continue to follow  Luis King, [place] Social Worker [phone]

## 2017-07-31 NOTE — Progress Notes (Signed)
Pharmacy Antibiotic Note  Luis King is a 83 y.o. male admitted on 07/25/2017 with  .  Pharmacy has been consulted to adjust ampicillin dose for enterococcus UTI.  Scr 0.98 , estimated CrCl ~ 50 ml/min.  Urine culture grew 70,000 col/ml enterococcus faecalis, technically do not need to treat 70k col/ml growth. MD started ampicillin dose at 250 mg po q6h which is appropriate but  I will adjust dose to 500mg  q8h since less frequent dosing interval should be easier to maintain better compliance.   Plan: Adjust Ampicillin dose to 500mg  po q8h Pharmacy will sign off.  Height: 5\' 7"  (170.2 cm) (estimated height) Weight: 160 lb (72.6 kg) IBW/kg (Calculated) : 66.1  Temp (24hrs), Avg:98.8 F (37.1 C), Min:98 F (36.7 C), Max:100 F (37.8 C)   Recent Labs Lab 07/26/17 0630 07/26/17 1603 07/27/17 0313 07/28/17 0622 07/29/17 0815 07/30/17 0630 07/31/17 0608 07/31/17 0851  WBC  --   --  8.0 13.2* 13.0* 9.9  --  8.8  CREATININE  --   --  1.03 1.13 1.12 1.01 0.98  --   LATICACIDVEN 1.5 2.1*  --   --   --   --   --   --     Estimated Creatinine Clearance: 51.5 mL/min (by C-G formula based on SCr of 0.98 mg/dL).    Allergies  Allergen Reactions  . Shellfish Allergy Hives    Antimicrobials this admission: Ampicillin  10/25>   Microbiology results: 10/20 BCx: ngtd x 4 days  10/23 UCx:  Enterococcus faecalis 70,000 col/ml (pan sensitive)  Thank you for allowing pharmacy to be a part of this patient's care.  Nicole Cella, [place] Clinical Pharmacist Pager: [phone]-[phone] [phone] 330p-[phone] phone [phone] or [phone] Main pharmacy [phone] 07/31/2017 2:20 PM

## 2017-08-01 DIAGNOSIS — R41841 Cognitive communication deficit: Secondary | ICD-10-CM | POA: Diagnosis not present

## 2017-08-01 DIAGNOSIS — G301 Alzheimer's disease with late onset: Secondary | ICD-10-CM | POA: Diagnosis not present

## 2017-08-01 DIAGNOSIS — I10 Essential (primary) hypertension: Secondary | ICD-10-CM | POA: Diagnosis not present

## 2017-08-01 DIAGNOSIS — R4182 Altered mental status, unspecified: Secondary | ICD-10-CM | POA: Diagnosis not present

## 2017-08-01 DIAGNOSIS — R0989 Other specified symptoms and signs involving the circulatory and respiratory systems: Secondary | ICD-10-CM | POA: Diagnosis not present

## 2017-08-01 DIAGNOSIS — N390 Urinary tract infection, site not specified: Secondary | ICD-10-CM | POA: Diagnosis not present

## 2017-08-01 DIAGNOSIS — G9340 Encephalopathy, unspecified: Secondary | ICD-10-CM | POA: Diagnosis not present

## 2017-08-01 DIAGNOSIS — R05 Cough: Secondary | ICD-10-CM | POA: Diagnosis not present

## 2017-08-01 DIAGNOSIS — E042 Nontoxic multinodular goiter: Secondary | ICD-10-CM | POA: Diagnosis not present

## 2017-08-01 DIAGNOSIS — F339 Major depressive disorder, recurrent, unspecified: Secondary | ICD-10-CM | POA: Diagnosis not present

## 2017-08-01 DIAGNOSIS — G9341 Metabolic encephalopathy: Secondary | ICD-10-CM | POA: Diagnosis not present

## 2017-08-01 DIAGNOSIS — F3342 Major depressive disorder, recurrent, in full remission: Secondary | ICD-10-CM | POA: Diagnosis not present

## 2017-08-01 DIAGNOSIS — J189 Pneumonia, unspecified organism: Secondary | ICD-10-CM | POA: Diagnosis not present

## 2017-08-01 DIAGNOSIS — R1312 Dysphagia, oropharyngeal phase: Secondary | ICD-10-CM | POA: Diagnosis not present

## 2017-08-01 DIAGNOSIS — M6281 Muscle weakness (generalized): Secondary | ICD-10-CM | POA: Diagnosis not present

## 2017-08-01 DIAGNOSIS — E119 Type 2 diabetes mellitus without complications: Secondary | ICD-10-CM | POA: Diagnosis not present

## 2017-08-01 DIAGNOSIS — R488 Other symbolic dysfunctions: Secondary | ICD-10-CM | POA: Diagnosis not present

## 2017-08-01 DIAGNOSIS — R296 Repeated falls: Secondary | ICD-10-CM | POA: Diagnosis not present

## 2017-08-01 DIAGNOSIS — G309 Alzheimer's disease, unspecified: Secondary | ICD-10-CM | POA: Diagnosis not present

## 2017-08-01 LAB — BASIC METABOLIC PANEL
ANION GAP: 11 (ref 5–15)
BUN: 15 mg/dL (ref 6–20)
CALCIUM: 8.4 mg/dL — AB (ref 8.9–10.3)
CHLORIDE: 112 mmol/L — AB (ref 101–111)
CO2: 23 mmol/L (ref 22–32)
Creatinine, Ser: 1.02 mg/dL (ref 0.61–1.24)
GFR calc non Af Amer: >60 mL/min (ref >60)
Glucose, Bld: 191 mg/dL — ABNORMAL HIGH (ref 65–99)
Potassium: 3.3 mmol/L — ABNORMAL LOW (ref 3.5–5.1)
Sodium: 146 mmol/L — ABNORMAL HIGH (ref 135–145)

## 2017-08-01 LAB — GLUCOSE, CAPILLARY
GLUCOSE-CAPILLARY: 168 mg/dL — AB (ref 65–99)
Glucose-Capillary: 185 mg/dL — ABNORMAL HIGH (ref 65–99)
Glucose-Capillary: 187 mg/dL — ABNORMAL HIGH (ref 65–99)
Glucose-Capillary: 222 mg/dL — ABNORMAL HIGH (ref 65–99)

## 2017-08-01 LAB — CBC
HEMATOCRIT: 40.5 % (ref 39.0–52.0)
Hemoglobin: 13.1 g/dL (ref 13.0–17.0)
MCH: 29.4 pg (ref 26.0–34.0)
MCHC: 32.3 g/dL (ref 30.0–36.0)
MCV: 90.8 fL (ref 78.0–100.0)
Platelets: 182 10*3/uL (ref 150–400)
RBC: 4.46 MIL/uL (ref 4.22–5.81)
RDW: 13.6 % (ref 11.5–15.5)
WBC: 8.7 10*3/uL (ref 4.0–10.5)

## 2017-08-01 LAB — HEMOGLOBIN A1C
Hgb A1c MFr Bld: 8.3 % — ABNORMAL HIGH (ref 4.8–5.6)
MEAN PLASMA GLUCOSE: 191.51 mg/dL

## 2017-08-01 MED ORDER — POTASSIUM CHLORIDE CRYS ER 20 MEQ PO TBCR
40.0000 meq | EXTENDED_RELEASE_TABLET | Freq: Once | ORAL | Status: AC
  Administered 2017-08-01: 40 meq via ORAL
  Filled 2017-08-01: qty 2

## 2017-08-01 MED ORDER — METOPROLOL TARTRATE 25 MG PO TABS: 12.5000 mg | ORAL_TABLET | Freq: Two times a day (BID) | ORAL | 3 refills | Status: AC

## 2017-08-01 MED ORDER — AMPICILLIN 500 MG PO CAPS: 500.0000 mg | ORAL_CAPSULE | Freq: Three times a day (TID) | ORAL | 0 refills | Status: AC

## 2017-08-01 NOTE — Progress Notes (Signed)
Per shift report, pt ready for pickup to be transported to [facility].  Pt report, current vitals, and forms given to transportation team.  Pt stable for transport.  No further issues to report at this time.

## 2017-08-01 NOTE — Care Management Note (Signed)
Case Management Note  Patient Details  Name: Luis King MRN: [id number] Date of Birth: 1930/08/09  Subjective/Objective:                    Action/Plan: Pt discharging to [facility]. No further needs per CM.   Expected Discharge Date:  08/01/17               Expected Discharge Plan:  Skilled Nursing Facility  In-House Referral:  Clinical Social Work  Discharge planning Services  CM Consult  Post Acute Care Choice:    Choice offered to:     DME Arranged:    DME Agency:     HH Arranged:    [place] Agency:     Status of Service:  Completed, signed off  If discussed at [employer], dates discussed:    Additional Comments:  Pollie Friar, RN 08/01/2017, 2:19 PM

## 2017-08-01 NOTE — Clinical Social Work Placement (Signed)
   CLINICAL SOCIAL WORK PLACEMENT  NOTE  Date:  08/01/2017  Patient Details  Name: Luis King MRN: [id number] Date of Birth: Dec 27, 1929  Clinical Social Work is seeking post-discharge placement for this patient at the [place] level of care (*CSW will initial, date and re-position this form in  chart as items are completed):  Yes   Patient/family provided with [place] Work Department's list of facilities offering this level of care within the geographic area requested by the patient (or if unable, by the patient's family).  Yes   Patient/family informed of their freedom to choose among providers that offer the needed level of care, that participate in Medicare, Medicaid or managed care program needed by the patient, have an available bed and are willing to accept the patient.  Yes   Patient/family informed of [facility]'s ownership interest in [facility] and [facility], as well as of the fact that they are under no obligation to receive care at these facilities.  PASRR submitted to EDS on 07/30/17     PASRR number received on 07/30/17     Existing PASRR number confirmed on       FL2 transmitted to all facilities in geographic area requested by pt/family on 07/30/17     FL2 transmitted to all facilities within larger geographic area on       Patient informed that his/her managed care company has contracts with or will negotiate with certain facilities, including the following:        Yes   Patient/family informed of bed offers received.  Patient chooses bed at [facility]     Physician recommends and patient chooses bed at      Patient to be transferred to [facility] on 08/01/17.  Patient to be transferred to facility by ptar     Patient family notified on 08/01/17 of transfer.  Name of family member notified:  Daryl     PHYSICIAN Please sign FL2     Additional Comment:     _______________________________________________ Jorge Ny, LCSW 08/01/2017, 1:55 PM

## 2017-08-01 NOTE — Progress Notes (Signed)
Patient will discharge to [facility] Anticipated discharge date: 10/26 Family notified: pt son Transportation by [employer]- scheduled for 3pm  CSW signing off.  Jorge Ny, LCSW Clinical Social Worker [phone]

## 2017-08-01 NOTE — Discharge Summary (Signed)
Physician Discharge Summary   Patient ID: Luis King MRN: [id number] DOB/AGE: 81-13-1931 81 y.o.  Admit date: 07/25/2017 Discharge date: 08/01/2017  Primary Care Physician:  Lucianne Lei, MD  Discharge Diagnoses:    . Acute metabolic encephalopathy . Acute enterococcus faecalis UTI . HTN (hypertension) Dementia Aspiration risk Sepsis secondary to UTI Sinus tachycardia Diabetes mellitus type 2 Hypertension Multinodular goiter  Consults:  None   Recommendations for Outpatient Follow-up:  1. Please repeat CBC/BMET at next visit 2. multinodular goiter -Noted on CT cervical spine, TSH 1.98, free T4 1.1, T3 68 -Outpatient workup  recommended   DIET: dysphagia 1, thin       Allergies:   Allergies  Allergen Reactions  . Shellfish Allergy Hives     DISCHARGE MEDICATIONS: Current Discharge Medication List    START taking these medications   Details  ampicillin (PRINCIPEN) 500 MG capsule Take 1 capsule (500 mg total) by mouth every 8 (eight) hours. X 6 more days Qty: 18 capsule, Refills: 0    metoprolol tartrate (LOPRESSOR) 25 MG tablet Take 0.5 tablets (12.5 mg total) by mouth 2 (two) times daily. Qty: 60 tablet, Refills: 3      CONTINUE these medications which have NOT CHANGED   Details  celecoxib (CELEBREX) 200 MG capsule Take 200 mg by mouth daily.    insulin aspart protamine- aspart (NOVOLOG MIX 70/30) (70-30) 100 UNIT/ML injection Inject 10-15 Units into the skin See admin instructions. [place] 10 units in the morning; [place] 15 units in the evening.    ketoconazole (NIZORAL) 2 % cream Apply 1 application topically 2 (two) times daily.    L-Methylfolate-Algae-B12-B6 (METANX PO) Take 1 tablet by mouth 2 (two) times daily.    predniSONE (DELTASONE) 5 MG tablet Take 7.5 mg by mouth daily.      STOP taking these medications     furosemide (LASIX) 40 MG tablet      hydrochlorothiazide (HYDRODIURIL) 12.5 MG tablet      lisinopril (PRINIVIL,ZESTRIL) 20 MG  tablet          Brief H and P: For complete details please refer to admission H and P, but in brief HPI On 07/26/2017 by Dr. Adline Mango [name] a 83 y.o.malewith medical history significant of Alzheimer's, DM, HTN. Patient brought in by son after a fall at home. Not injured and not in pain but son notes that over past 2 weeks has had gradual increased weakness, difficulty completing ADLs.  Patient was found to have UTI.  Hospital Course:   Acute metabolic encephalopathy likely secondary to UTI, in the setting of underlying advanced dementia -CT head showed atrophy with chronic appearing small vessel ischemia, no acute intracranial abnormality -Urine culture showed Enterococcus faecalis, changed antibiotics to ampicillin oral, continue for 6 more days to complete full course  Sepsis secondary to UTI, acute lower UTI -Patient met sepsis criteria with fever, tachycardia, tachypnea, UA positive for UTI -Blood cultures negative so far -Chest x-ray showed no pneumonia -Urine culture showed 70,000 colonies of Enterococcus faecalis, antibiotics changed to ampicillin for 7 days  tachycardia -EKG showed sinus tachycardia, d-dimer elevated 10.1, hence CT angiogram of the chest was obtained, showed no PE, subsegmental consolidative changes at the left lung base may represent atelectasis versus infiltrate.  Right hilar adenopathy of indeterminate etiology.  Incidental finding of right thyroid gland nodule. -No palpitations, placed on low-dose metoprolol -TSH 1.98    DM2 (diabetes mellitus, type 2) (HCC) -Hemoglobin A1c 8.3, recommend better glycemic control outpatient  HTN (hypertension) -Currently BP stable, patient started on low-dose metoprolol - lisinopril, Lasix, HCTZ held, follow BMET   Multinodular goiter -Noted on CT cervical spine, TSH 1.98, free T4 1.1, T3 68 -Outpatient workup  Hypokalemia -Replaced     Day of Discharge BP 131/74 (BP Location: Right  Arm)   Pulse (!) 108   Temp 99.3 F (37.4 C) (Oral)   Resp 20   Ht _0  (1.702 m) Comment: estimated height  Wt 73 kg (161 lb)   SpO2 96%   BMI 25.22 kg/m   Physical Exam: General: Alert and awake oriented x self, NAD HEENT: anicteric sclera, pupils reactive to light and accommodation CVS: S1-S2 clear no murmur rubs or gallops Chest: clear to auscultation bilaterally, no wheezing rales or rhonchi Abdomen: soft nontender, nondistended, normal bowel sounds Extremities: no cyanosis, clubbing or edema noted bilaterally Neuro: no new deficits   The results of significant diagnostics from this hospitalization (including imaging, microbiology, ancillary and laboratory) are listed below for reference.    LAB RESULTS: Basic Metabolic Panel:  Recent Labs Lab 07/31/17 0608 08/01/17 0559  NA 145 146*  K 4.7 3.3*  CL 110 112*  CO2 19* 23  GLUCOSE 177* 191*  BUN 19 15  CREATININE 0.98 1.02  CALCIUM 8.3* 8.4*   Liver Function Tests: No results for input(s): AST, ALT, ALKPHOS, BILITOT, PROT, ALBUMIN in the last 168 hours. No results for input(s): LIPASE, AMYLASE in the last 168 hours. No results for input(s): AMMONIA in the last 168 hours. CBC:  Recent Labs Lab 07/26/17 0032  07/31/17 0851 08/01/17 0559  WBC 5.2  < > 8.8 8.7  NEUTROABS 3.1  --   --   --   HGB 13.4  < > 13.8 13.1  HCT 41.1  < > 41.5 40.5  MCV 89.7  < > 89.8 90.8  PLT 118*  < > 159 182  < > = values in this interval not displayed. Cardiac Enzymes: No results for input(s): CKTOTAL, CKMB, CKMBINDEX, TROPONINI in the last 168 hours. BNP: Invalid input(s): POCBNP CBG:  Recent Labs Lab 07/31/17 2352 08/01/17 0608  GLUCAP 187* 185*    Significant Diagnostic Studies:  Dg Chest 2 View  Result Date: 07/26/2017 CLINICAL DATA:  83 y/o  M; fall and altered mental status. EXAM: CHEST  2 VIEW COMPARISON:  None. FINDINGS: Normal cardiac silhouette given projection and technique. Low lung volumes accentuate  pulmonary markings. No consolidation, effusion, or pneumothorax. No acute osseous abnormality is evident. IMPRESSION: Low lung volumes.  No acute pulmonary process identified. Electronically Signed   By: Kristine Garbe M.D.   On: 07/26/2017 00:15   Ct Head Wo Contrast  Result Date: 07/25/2017 CLINICAL DATA:  Minor head trauma. Alzheimer's disease. Altered mental status. EXAM: CT HEAD WITHOUT CONTRAST CT CERVICAL SPINE WITHOUT CONTRAST TECHNIQUE: Multidetector CT imaging of the head and cervical spine was performed following the standard protocol without intravenous contrast. Multiplanar CT image reconstructions of the cervical spine were also generated. COMPARISON:  None. FINDINGS: CT HEAD FINDINGS BRAIN: There is sulcal and ventricular prominence consistent with superficial and central atrophy. No intraparenchymal hemorrhage, mass effect nor midline shift. Periventricular and subcortical white matter hypodensities consistent with chronic small vessel ischemic disease are identified. Chronic appearing left colonic lacunar infarct. No acute large vascular territory infarcts. No abnormal extra-axial fluid collections. Basal cisterns are not effaced and midline. VASCULAR: Moderate calcific atherosclerosis of the carotid siphons. SKULL: No skull fracture. No significant scalp soft tissue swelling. SINUSES/ORBITS:  The mastoid air-cells are clear. The included paranasal sinuses are well-aerated.The included ocular globes and orbital contents are non-suspicious. OTHER: None. CT CERVICAL SPINE FINDINGS Alignment: Maintained cervical lordosis. Intact craniocervical relationship and atlantodental interval. Skull base and vertebrae: No acute fracture. No primary bone lesion or focal pathologic process. The left C4 vertebral foramen is slightly wider than left likely developmental. Soft tissues and spinal canal: No prevertebral fluid or swelling. No visible canal hematoma. Disc levels: Moderate disc space  narrowing C4-5 and C6-7 with small posterior marginal osteophytes. Mild disc space narrowing at C5-6. No jumped or perched facets. Partial ankylosis of the left C2-3 facet. Mild left C3-4 and bilateral C7-T1 facet arthropathy with joint space narrowing and sclerosis. Bilateral uncovertebral joint osteoarthritis with spurring noted at C4-5 and C6-7. Right-sided uncovertebral spurring at C5-6. Upper chest:  Nonacute Other: The thyroid gland is enlarged, right lobe greater than left with heterogeneous appearing parenchyma. Small bilateral cystic nodules are noted, some of which may complex with proteinaceous material within. IMPRESSION: 1. Atrophy with chronic appearing small vessel ischemia. No acute intracranial abnormality. 2. Cervical spondylosis without acute cervical spine fracture or posttraumatic subluxation. 3. Heterogeneous enlargement of the thyroid gland consistent with multinodular goiter. Electronically Signed   By: Ashley Royalty M.D.   On: 07/25/2017 23:55   Ct Cervical Spine Wo Contrast  Result Date: 07/25/2017 CLINICAL DATA:  Minor head trauma. Alzheimer's disease. Altered mental status. EXAM: CT HEAD WITHOUT CONTRAST CT CERVICAL SPINE WITHOUT CONTRAST TECHNIQUE: Multidetector CT imaging of the head and cervical spine was performed following the standard protocol without intravenous contrast. Multiplanar CT image reconstructions of the cervical spine were also generated. COMPARISON:  None. FINDINGS: CT HEAD FINDINGS BRAIN: There is sulcal and ventricular prominence consistent with superficial and central atrophy. No intraparenchymal hemorrhage, mass effect nor midline shift. Periventricular and subcortical white matter hypodensities consistent with chronic small vessel ischemic disease are identified. Chronic appearing left colonic lacunar infarct. No acute large vascular territory infarcts. No abnormal extra-axial fluid collections. Basal cisterns are not effaced and midline. VASCULAR: Moderate  calcific atherosclerosis of the carotid siphons. SKULL: No skull fracture. No significant scalp soft tissue swelling. SINUSES/ORBITS: The mastoid air-cells are clear. The included paranasal sinuses are well-aerated.The included ocular globes and orbital contents are non-suspicious. OTHER: None. CT CERVICAL SPINE FINDINGS Alignment: Maintained cervical lordosis. Intact craniocervical relationship and atlantodental interval. Skull base and vertebrae: No acute fracture. No primary bone lesion or focal pathologic process. The left C4 vertebral foramen is slightly wider than left likely developmental. Soft tissues and spinal canal: No prevertebral fluid or swelling. No visible canal hematoma. Disc levels: Moderate disc space narrowing C4-5 and C6-7 with small posterior marginal osteophytes. Mild disc space narrowing at C5-6. No jumped or perched facets. Partial ankylosis of the left C2-3 facet. Mild left C3-4 and bilateral C7-T1 facet arthropathy with joint space narrowing and sclerosis. Bilateral uncovertebral joint osteoarthritis with spurring noted at C4-5 and C6-7. Right-sided uncovertebral spurring at C5-6. Upper chest:  Nonacute Other: The thyroid gland is enlarged, right lobe greater than left with heterogeneous appearing parenchyma. Small bilateral cystic nodules are noted, some of which may complex with proteinaceous material within. IMPRESSION: 1. Atrophy with chronic appearing small vessel ischemia. No acute intracranial abnormality. 2. Cervical spondylosis without acute cervical spine fracture or posttraumatic subluxation. 3. Heterogeneous enlargement of the thyroid gland consistent with multinodular goiter. Electronically Signed   By: Ashley Royalty M.D.   On: 07/25/2017 23:55    2D ECHO:   Disposition  and Follow-up: Discharge Instructions    Diet Carb Modified    Complete by:  As directed    Increase activity slowly    Complete by:  As directed        DISPOSITION: [place]    Lucianne Lei, MD. Schedule an appointment as soon as possible for a visit in 2 week(s).   Specialty:  Family Medicine Contact information: [address] [place] [address] [phone]            Time spent on Discharge: 53mns   Signed:   REstill CottaM.D. [facility] 08/01/2017, 10:15 AM Pager: [phone]

## 2017-08-01 NOTE — Progress Notes (Signed)
Report given to receiving RN from [facility].

## 2017-08-01 NOTE — Progress Notes (Signed)
Physical Therapy Treatment Patient Details Name: Luis King MRN: [id number] DOB: 11/02/1929 Today's Date: 08/01/2017    History of Present Illness Pt is an 83 y.o.malewith PMH of Alzheimer's, DM, HTN. Patient was brought to ED by son after a fall at home. Son noted that over past 2 weeks has had gradual increased weakness and difficulty completing ADLs.    PT Comments    Pt seen for mobility progression. He continues to require heavy physical assist of two people for functional mobility. Pt would continue to benefit from skilled physical therapy services at this time while admitted and after d/c to address the below listed limitations in order to improve overall safety and independence with functional mobility.    Follow Up Recommendations  SNF;Supervision/Assistance - 24 hour     Equipment Recommendations  None recommended by PT;Other (comment) (defer to next venue)    Recommendations for Other Services       Precautions / Restrictions Precautions Precautions: Fall Restrictions Weight Bearing Restrictions: No    Mobility  Bed Mobility Overal bed mobility: Needs Assistance Bed Mobility: Supine to Sit;Sit to Supine     Supine to sit: Max assist;+2 for physical assistance Sit to supine: Total assist;+2 for physical assistance   General bed mobility comments: cueing for sequencing, assist to elevate trunk and for management of bilateral LEs  Transfers Overall transfer level: Needs assistance Equipment used: 2 person hand held assist Transfers: Sit to/from Stand Sit to Stand: Max assist;+2 physical assistance         General transfer comment: assist of two to rise of elevated bed position; face-to-face with gait belt; pt able to stand with mod-max A x2 for pericare  Ambulation/Gait                 Stairs            Wheelchair Mobility    Modified Rankin (Stroke Patients Only)       Balance Overall balance assessment: Needs  assistance Sitting-balance support: Feet supported Sitting balance-Leahy Scale: Poor Sitting balance - Comments: pt with heavy posterior lean and pushing; required mod-max A  Postural control: Posterior lean Standing balance support: During functional activity;Bilateral upper extremity supported Standing balance-Leahy Scale: Poor Standing balance comment: mod-max A x2                            Cognition Arousal/Alertness: Awake/alert Behavior During Therapy: Anxious Overall Cognitive Status: Impaired/Different from baseline Area of Impairment: Orientation;Attention;Memory;Following commands;Safety/judgement;Awareness;Problem solving                 Orientation Level: Disoriented to;Time;Place;Situation Current Attention Level: Focused Memory: Decreased short-term memory Following Commands: Follows one step commands inconsistently;Follows one step commands with increased time Safety/Judgement: Decreased awareness of deficits;Decreased awareness of safety Awareness: Intellectual Problem Solving: Slow processing;Decreased initiation;Difficulty sequencing;Requires verbal cues;Requires tactile cues        Exercises      General Comments        Pertinent Vitals/Pain Pain Assessment: Faces Faces Pain Scale: No hurt    Home Living                      Prior Function            PT Goals (current goals can now be found in the care plan section) Acute Rehab PT Goals PT Goal Formulation: Patient unable to participate in goal setting Time For Goal Achievement: 08/11/17 Potential to  Achieve Goals: Fair Progress towards PT goals: Progressing toward goals    Frequency    Min 3X/week      PT Plan Current plan remains appropriate    Co-evaluation              AM-PAC PT "6 Clicks" Daily Activity  Outcome Measure  Difficulty turning over in bed (including adjusting bedclothes, sheets and blankets)?: Unable Difficulty moving from lying on  back to sitting on the side of the bed? : Unable Difficulty sitting down on and standing up from a chair with arms (e.g., wheelchair, bedside commode, etc,.)?: Unable Help needed moving to and from a bed to chair (including a wheelchair)?: Total Help needed walking in hospital room?: Total Help needed climbing 3-5 steps with a railing? : Total 6 Click Score: 6    End of Session Equipment Utilized During Treatment: Gait belt Activity Tolerance: Patient tolerated treatment well Patient left: in bed;with call bell/phone within reach;with family/visitor present Nurse Communication: Mobility status;Need for lift equipment PT Visit Diagnosis: Muscle weakness (generalized) (M62.81);Other symptoms and signs involving the nervous system (R29.898);Other abnormalities of gait and mobility (R26.89)     Time: 9735-3299 PT Time Calculation (min) (ACUTE ONLY): 20 min  Charges:  $Therapeutic Activity: 8-22 mins                    G Codes:       [place], [place], [place] 08/01/2017, 4:06 PM

## 2017-08-04 ENCOUNTER — Ambulatory Visit (INDEPENDENT_AMBULATORY_CARE_PROVIDER_SITE_OTHER): Payer: Self-pay | Admitting: Orthopedic Surgery

## 2017-08-07 DIAGNOSIS — M6281 Muscle weakness (generalized): Secondary | ICD-10-CM | POA: Diagnosis not present

## 2017-08-07 DIAGNOSIS — E119 Type 2 diabetes mellitus without complications: Secondary | ICD-10-CM | POA: Diagnosis not present

## 2017-08-07 DIAGNOSIS — F339 Major depressive disorder, recurrent, unspecified: Secondary | ICD-10-CM | POA: Diagnosis not present

## 2017-08-07 DIAGNOSIS — G9340 Encephalopathy, unspecified: Secondary | ICD-10-CM | POA: Diagnosis not present

## 2017-08-08 DIAGNOSIS — I10 Essential (primary) hypertension: Secondary | ICD-10-CM | POA: Diagnosis not present

## 2017-08-08 DIAGNOSIS — G301 Alzheimer's disease with late onset: Secondary | ICD-10-CM | POA: Diagnosis not present

## 2017-08-08 DIAGNOSIS — F3342 Major depressive disorder, recurrent, in full remission: Secondary | ICD-10-CM | POA: Diagnosis not present

## 2017-08-08 DIAGNOSIS — N390 Urinary tract infection, site not specified: Secondary | ICD-10-CM | POA: Diagnosis not present

## 2017-08-25 DIAGNOSIS — M6281 Muscle weakness (generalized): Secondary | ICD-10-CM | POA: Diagnosis not present

## 2017-08-25 DIAGNOSIS — G309 Alzheimer's disease, unspecified: Secondary | ICD-10-CM | POA: Diagnosis not present

## 2017-08-25 DIAGNOSIS — R1312 Dysphagia, oropharyngeal phase: Secondary | ICD-10-CM | POA: Diagnosis not present

## 2017-08-25 DIAGNOSIS — E119 Type 2 diabetes mellitus without complications: Secondary | ICD-10-CM | POA: Diagnosis not present

## 2017-08-27 ENCOUNTER — Other Ambulatory Visit: Payer: Self-pay | Admitting: *Deleted

## 2017-08-27 NOTE — Patient Outreach (Signed)
Call to patient home, spoke with wife.  She states that patient is still at facility. She requests that RNCM speak with son Coralyn Pear at [phone] Plan to follow up with son, Coralyn Pear.  Royetta Crochet. Laymond Purser, RN, BSN, [place] [facility] Care Coordinator [phone]

## 2017-08-27 NOTE — Patient Outreach (Signed)
Spoke with Marita [place], SW at facility.  She reports patient will go home on Sunday to avoid co-pays.   He lives with wife and son.  He has memory issues.   She recommends speaking with patient wife or son.   Plan to follow up with patient family. Royetta Crochet. Laymond Purser, RN, BSN, [place] [phone]) Business Cell  [phone]) Toll Free Office

## 2017-08-27 NOTE — Patient Outreach (Signed)
Call attempt to patient son Coralyn Pear. Left VM requesting call with RNCM contact.  Plan to await son call back.  Royetta Crochet. Laymond Purser, RN, BSN, [place] [phone]) Business Cell  [phone]) Toll Free Office

## 2017-09-06 DIAGNOSIS — 419620001 Death: Secondary | SNOMED CT | POA: Diagnosis not present

## 2017-09-06 DEATH — deceased

## 2018-09-26 IMAGING — CT CT CERVICAL SPINE W/O CM
5 of 8 series · 12 of 33 positions shown, 13 images · non-contrast
Comparison: None.

CLINICAL DATA: Minor head trauma. Alzheimer's disease. Altered
mental status.

EXAM:
CT HEAD WITHOUT CONTRAST
CT CERVICAL SPINE WITHOUT CONTRAST
TECHNIQUE: Multidetector CT imaging of the head and cervical spine was
performed following the standard protocol without intravenous
contrast. Multiplanar CT image reconstructions of the cervical spine
were also generated.

[Series 5: head bone · axial · 0.44mm/px · z∈[-58,-2]mm · 2 of 86 slices shown]
[im 29/86  bone]
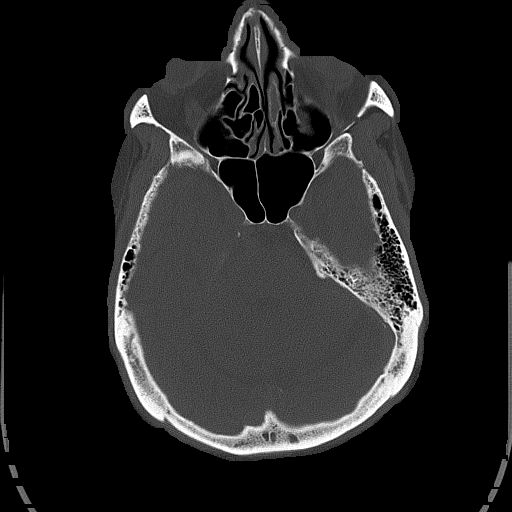
[im 57/86  bone]
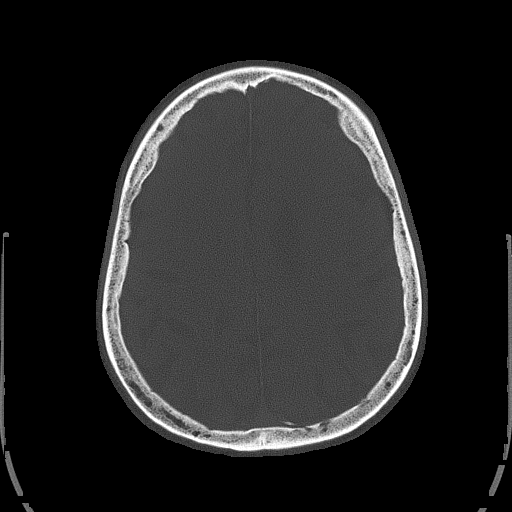

[Series 8: c_spine 2.0 st · axial · 0.27mm/px · z∈[-183,-129]mm · 2 of 83 slices shown, 3 images]
[im 28/83  soft-tissue]
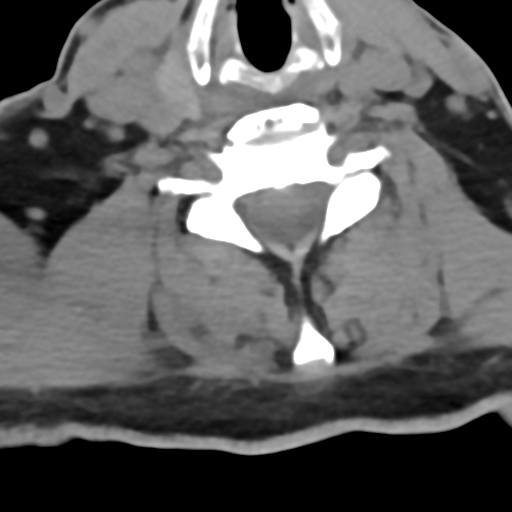
[im 28/83  bone]
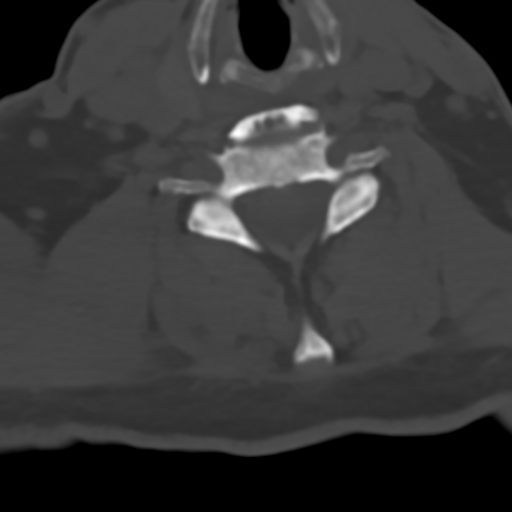
[im 55/83  bone]
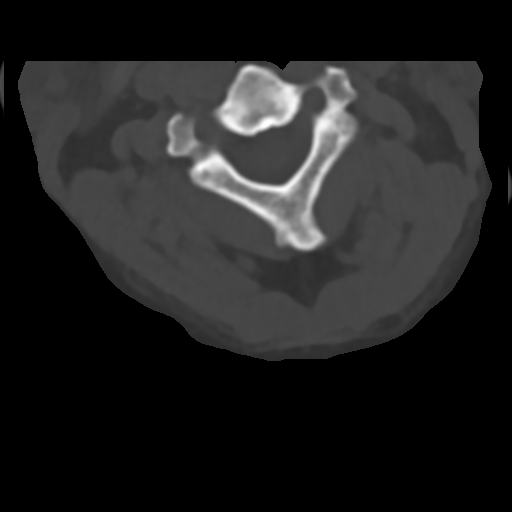

[Series 10: c_spine 2.0 sag bone · sagittal · 0.24mm/px · 5 of 61 slices shown]
[im 11/61  bone]
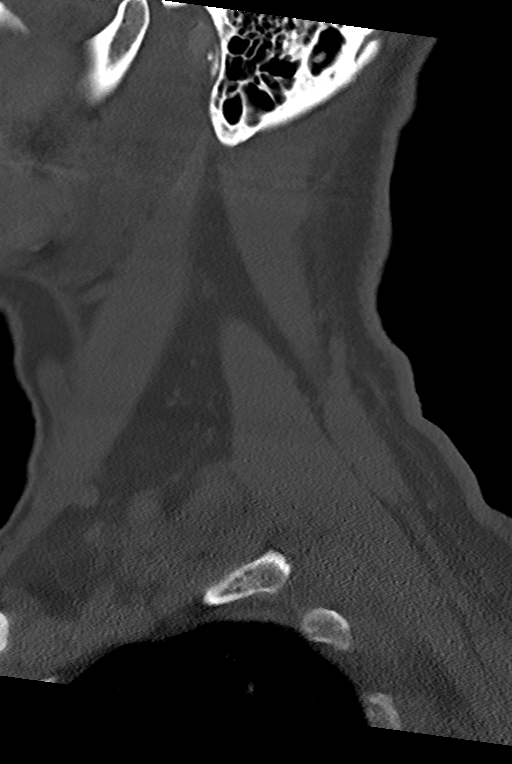
[im 21/61  bone]
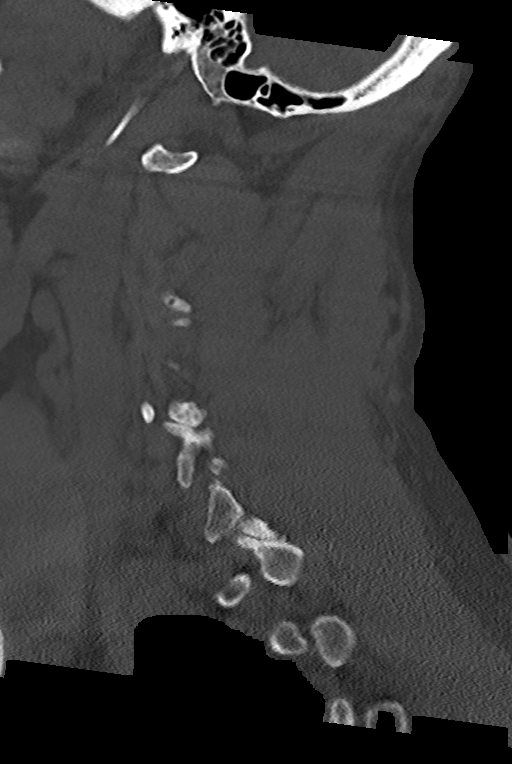
[im 31/61  bone]
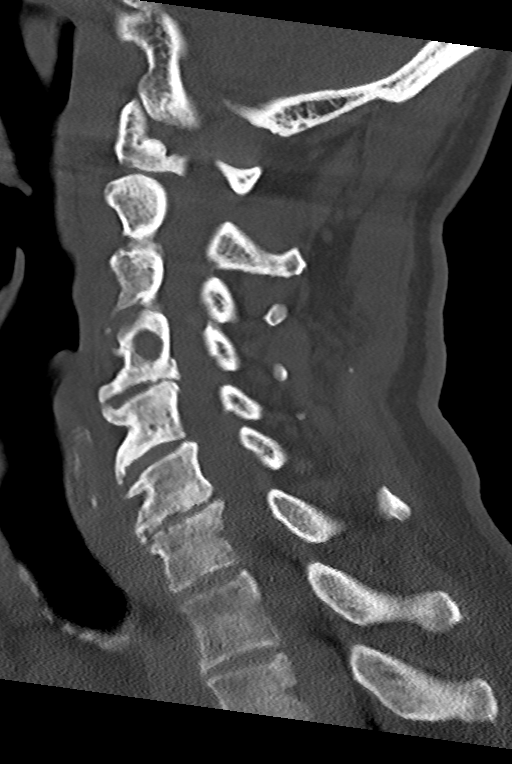
[im 41/61  bone]
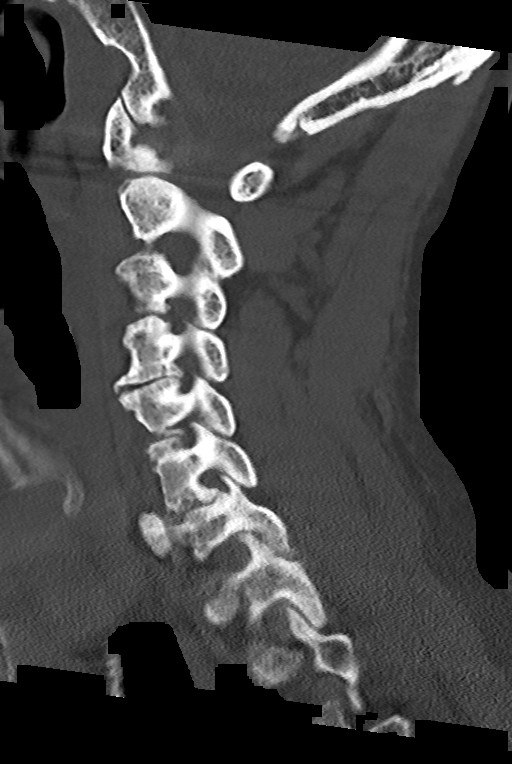
[im 51/61  bone]
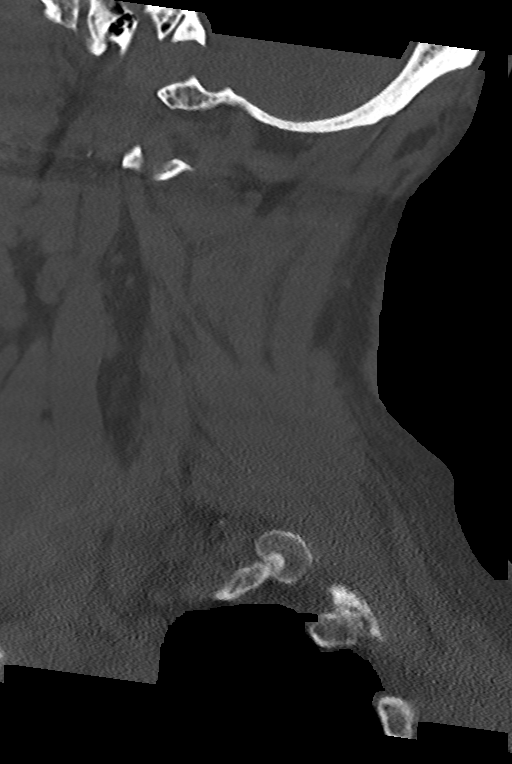

[Series 11: c_spine 2.0 cor bone · coronal · 0.24mm/px · 1 of 61 slices shown]
[im 31/61  bone]
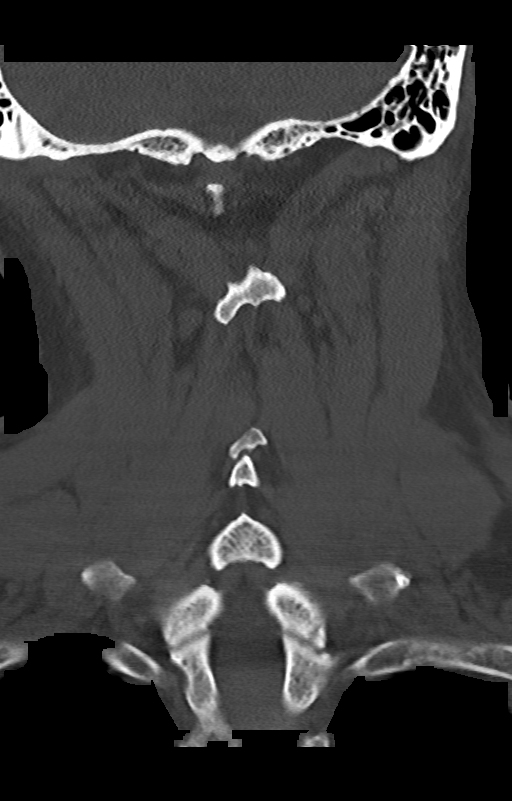

[Series 12: c_spine 2.0 orthogonals · axial · 0.21mm/px · z∈[-211,-154]mm · 2 of 88 slices shown]
[im 30/88  bone]
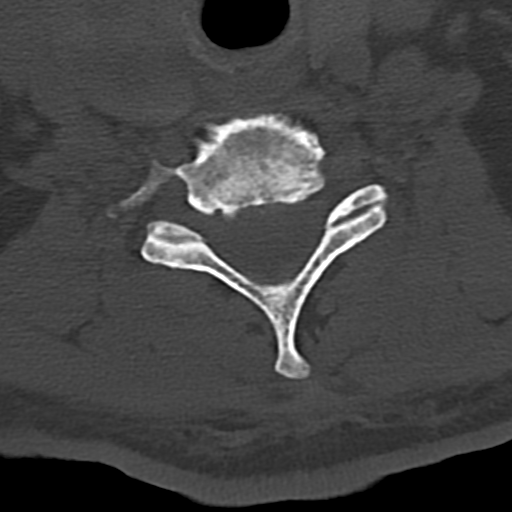
[im 59/88  bone]
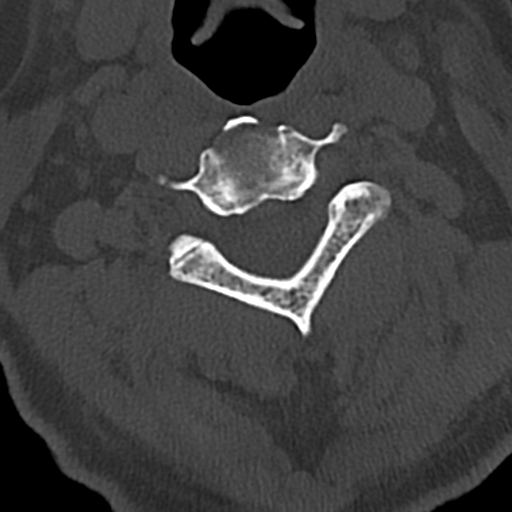

[12 of 33 positions shown; findings below may reference images not displayed]

FINDINGS: CT HEAD FINDINGS

BRAIN: There is sulcal and ventricular prominence consistent with
superficial and central atrophy. No intraparenchymal hemorrhage,
mass effect nor midline shift. Periventricular and subcortical white
matter hypodensities consistent with chronic small vessel ischemic
disease are identified. Chronic appearing left colonic lacunar
infarct. No acute large vascular territory infarcts. No abnormal
extra-axial fluid collections. Basal cisterns are not effaced and
midline.

VASCULAR: Moderate calcific atherosclerosis of the carotid siphons.

SKULL: No skull fracture. No significant scalp soft tissue swelling.

SINUSES/ORBITS: The mastoid air-cells are clear. The included
paranasal sinuses are well-aerated.The included ocular globes and
orbital contents are non-suspicious.

OTHER: None.

CT CERVICAL SPINE FINDINGS

Alignment: Maintained cervical lordosis. Intact craniocervical
relationship and atlantodental interval.

Skull base and vertebrae: No acute fracture. No primary bone lesion
or focal pathologic process. The left C4 vertebral foramen is
slightly wider than left likely developmental.

Soft tissues and spinal canal: No prevertebral fluid or swelling. No
visible canal hematoma.

Disc levels: Moderate disc space narrowing C4-5 and C6-7 with small
posterior marginal osteophytes. Mild disc space narrowing at C5-6.
No jumped or perched facets. Partial ankylosis of the left C2-3
facet. Mild left C3-4 and bilateral C7-T1 facet arthropathy with
joint space narrowing and sclerosis. Bilateral uncovertebral joint
osteoarthritis with spurring noted at C4-5 and C6-7. Right-sided
uncovertebral spurring at C5-6.

Upper chest:  Nonacute

Other: The thyroid gland is enlarged, right lobe greater than left
with heterogeneous appearing parenchyma. Small bilateral cystic
nodules are noted, some of which may complex with proteinaceous
material within.
IMPRESSION: 1. Atrophy with chronic appearing small vessel ischemia. No acute
intracranial abnormality.
2. Cervical spondylosis without acute cervical spine fracture or
posttraumatic subluxation.
3. Heterogeneous enlargement of the thyroid gland consistent with
multinodular goiter.

## 2018-09-27 IMAGING — CR DG CHEST 2V
2 series · 2 of 2 positions shown · non-contrast
Comparison: None.

CLINICAL DATA: 87 y/o  M; fall and altered mental status.

EXAM:
CHEST  2 VIEW

[chest lat]
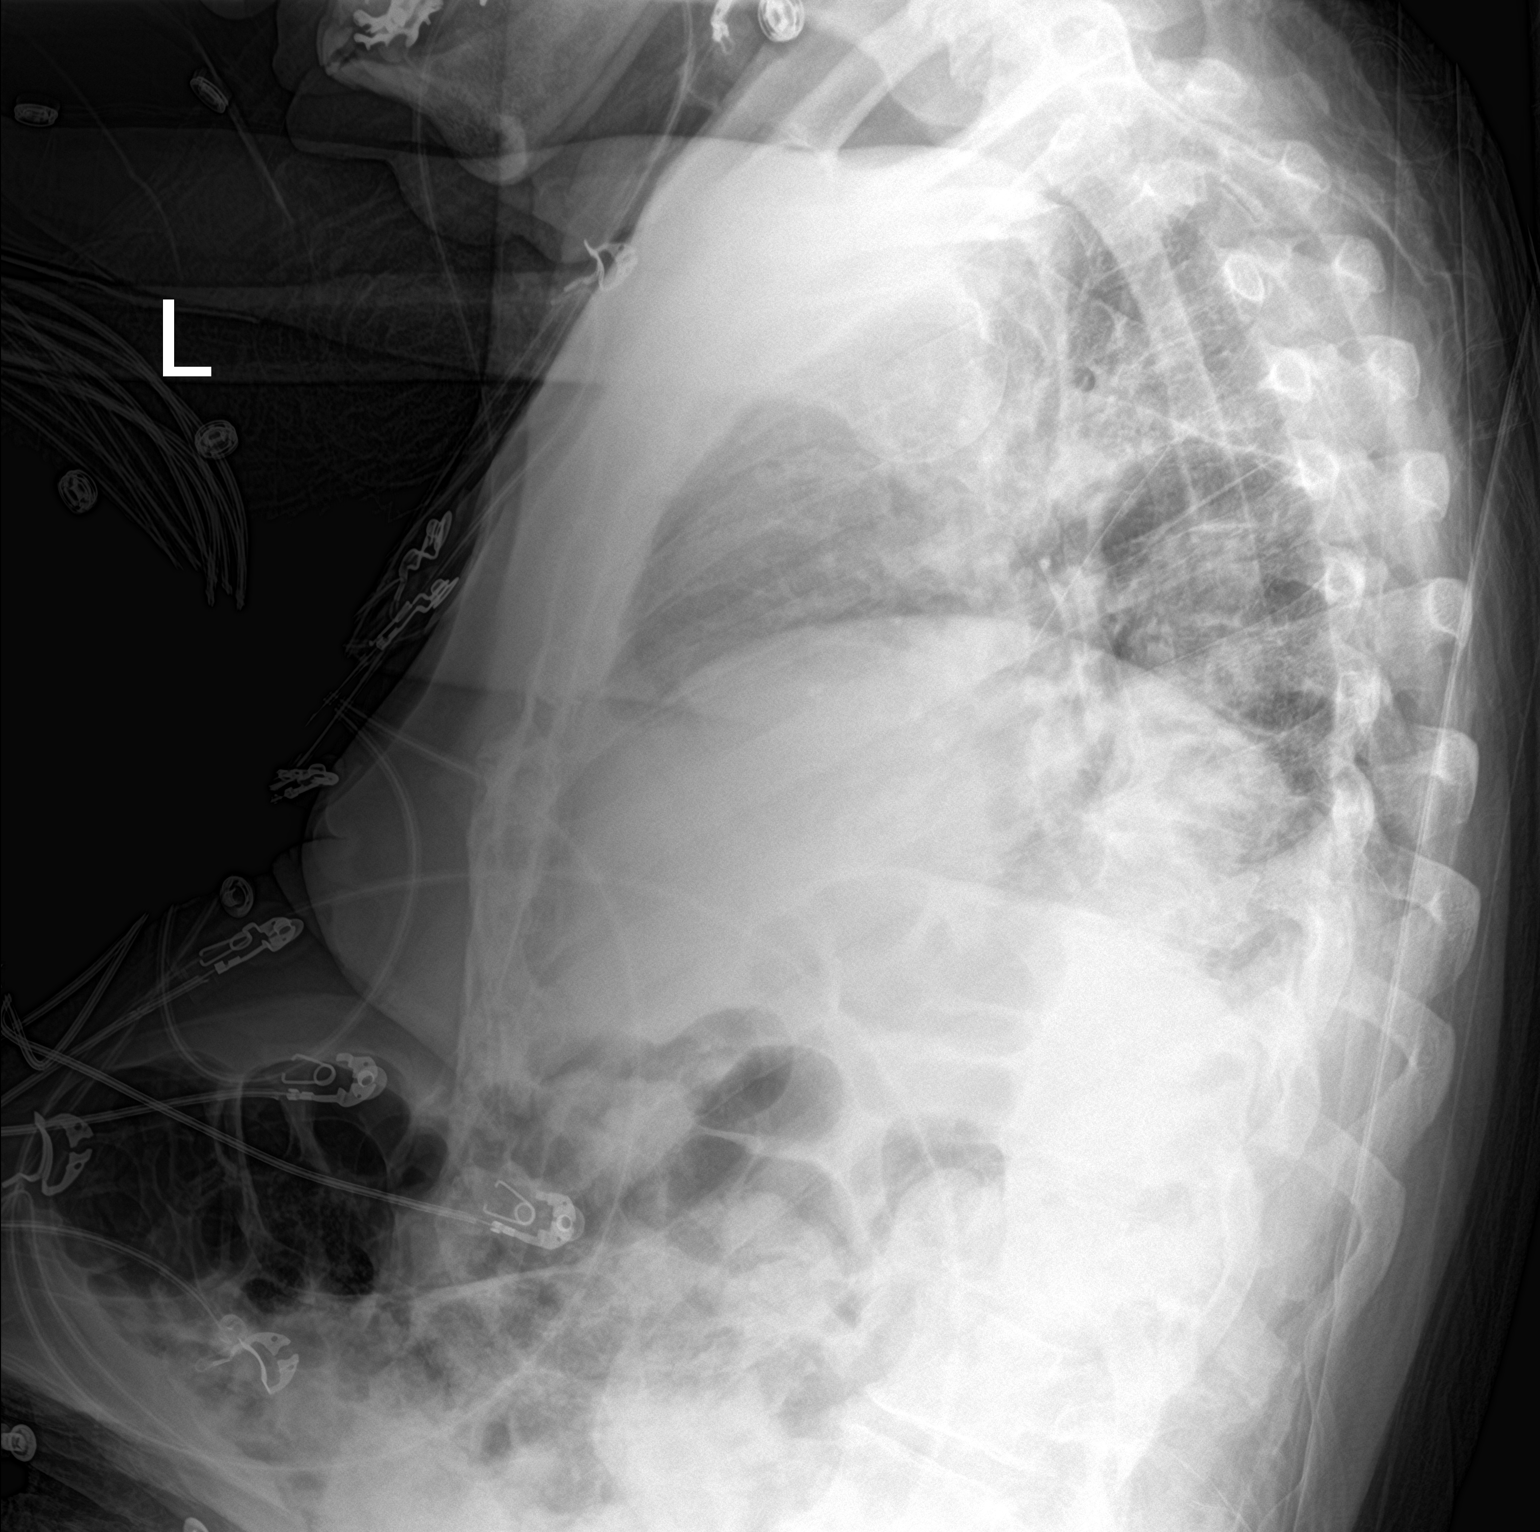

[chest ap]
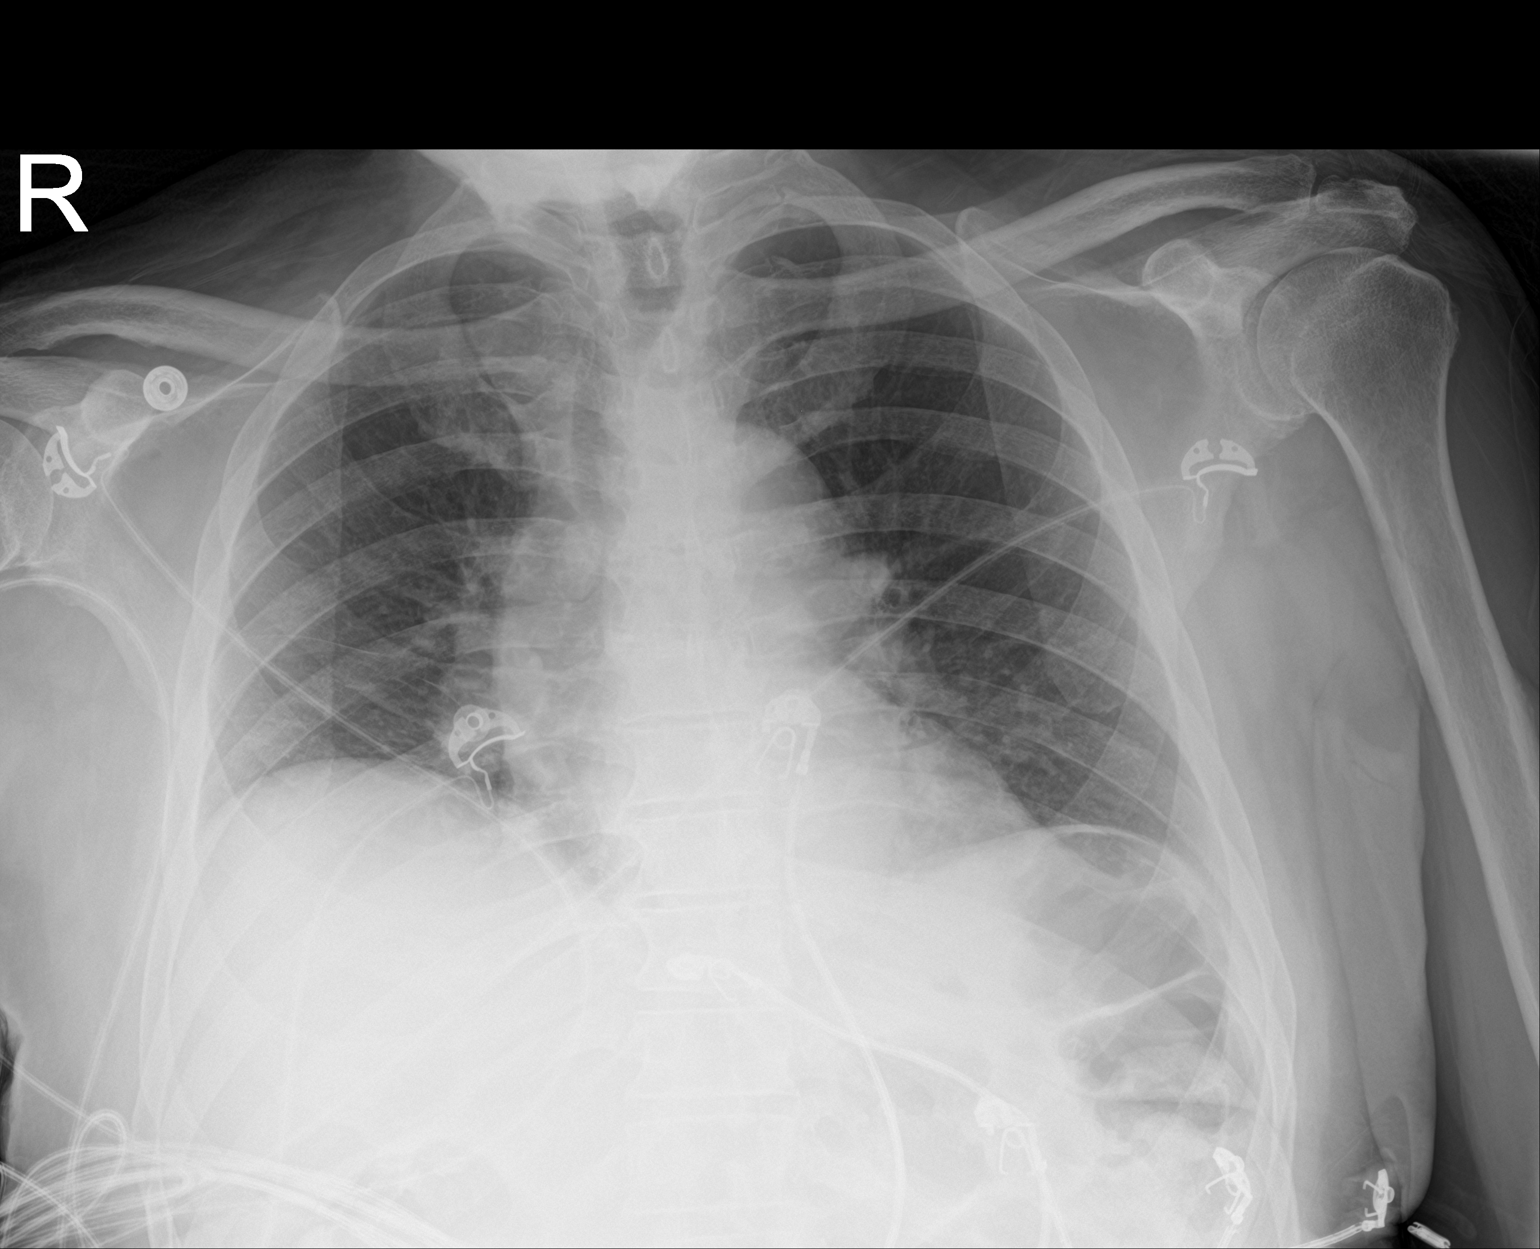

[2 of 2 positions shown; findings below may reference images not displayed]

FINDINGS: Normal cardiac silhouette given projection and technique. Low lung
volumes accentuate pulmonary markings. No consolidation, effusion,
or pneumothorax. No acute osseous abnormality is evident.
IMPRESSION: Low lung volumes.  No acute pulmonary process identified.

By: Cioaie Moroza M.D.

## 2018-09-30 IMAGING — DX DG CHEST 1V PORT
1 series · 1 of 1 positions shown · non-contrast
Comparison: Chest x-ray of 07/26/2017

CLINICAL DATA: Confusion, altered mental status, possible sepsis

EXAM:
PORTABLE CHEST 1 VIEW

[chest ap]
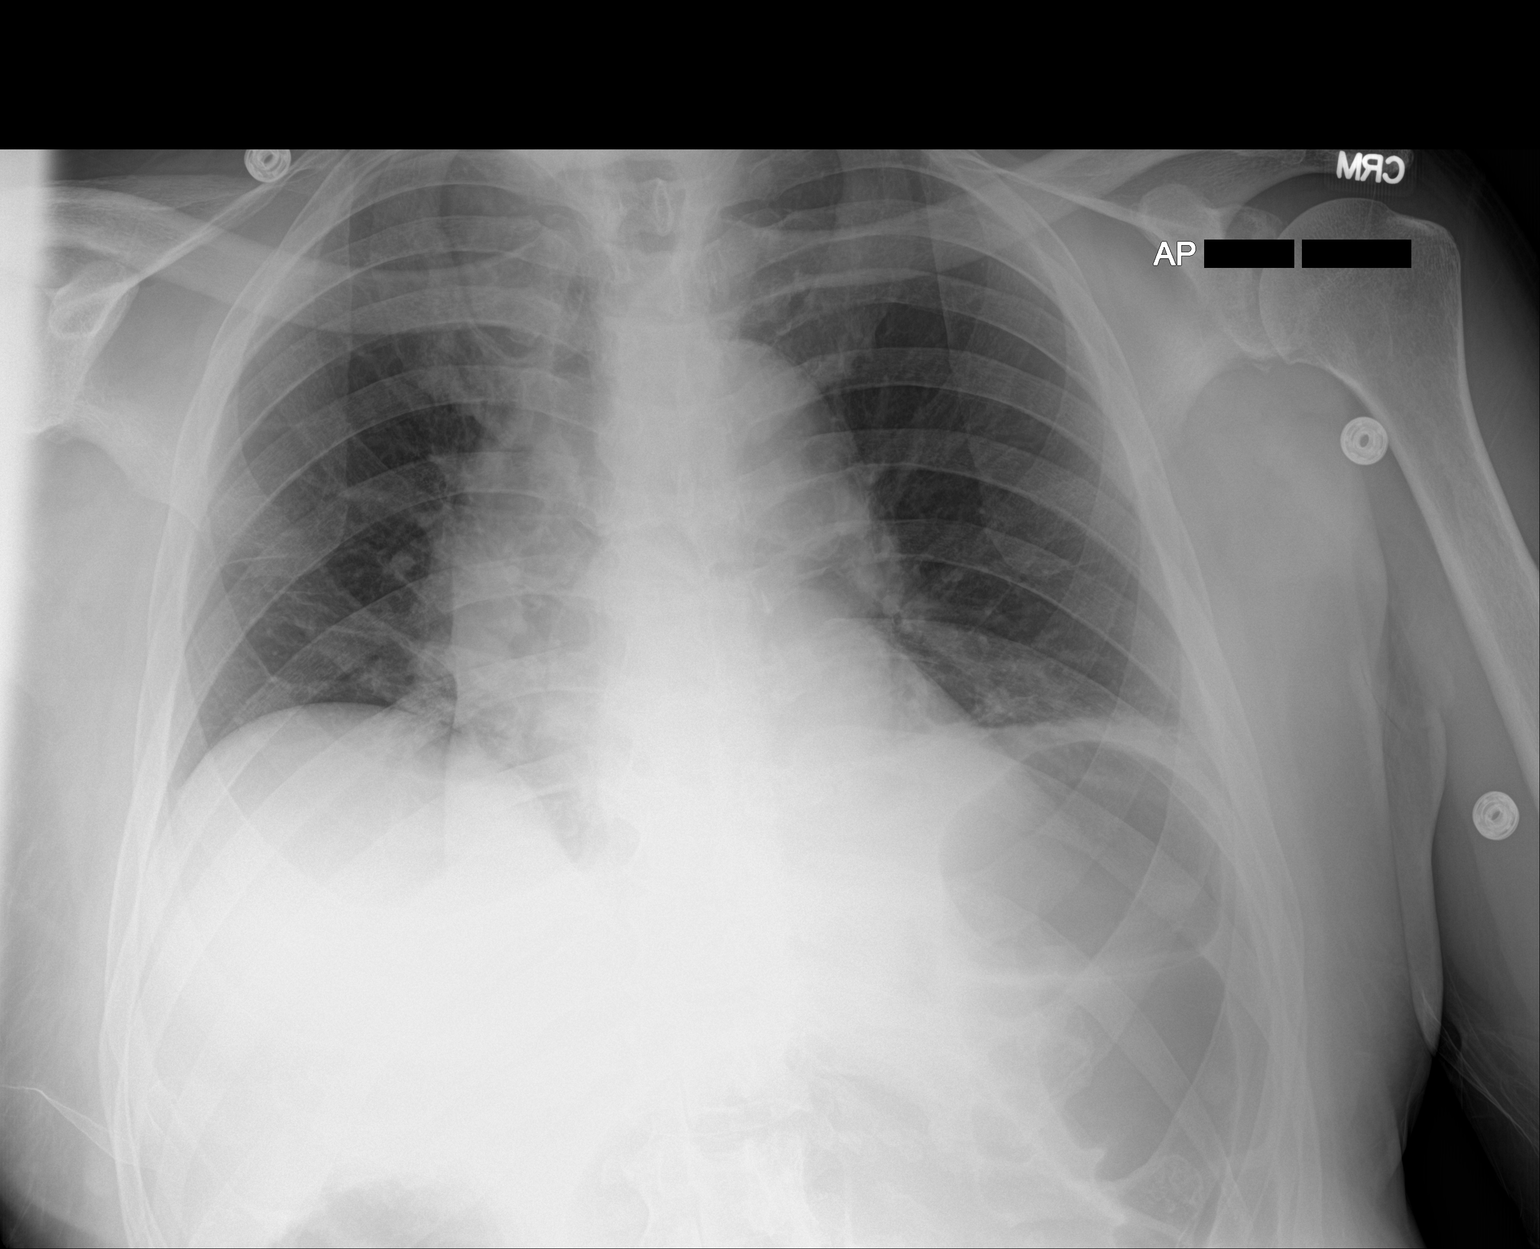

[1 of 1 positions shown; findings below may reference images not displayed]

FINDINGS: Minimally prominent markings at the lung bases most likely reflect
basilar linear atelectasis. No pneumonia or effusion is seen.
Mediastinal and hilar contours are unremarkable. The heart is mildly
enlarged and stable. No bony abnormality is seen.
IMPRESSION: Mild bibasilar linear atelectasis.  No definite active process.
